# Patient Record
Sex: Female | Born: 1995 | Race: Black or African American | Hispanic: No | Marital: Single | Smoking: Never smoker
Health system: Southern US, Community
[De-identification: ages and names within clinical notes are randomized; demographics above are authoritative.]

## PROBLEM LIST (undated history)

## (undated) ENCOUNTER — Inpatient Hospital Stay (HOSPITAL_COMMUNITY): Payer: Self-pay

## (undated) DIAGNOSIS — N920 Excessive and frequent menstruation with regular cycle: Secondary | ICD-10-CM

## (undated) DIAGNOSIS — R011 Cardiac murmur, unspecified: Secondary | ICD-10-CM

## (undated) DIAGNOSIS — A599 Trichomoniasis, unspecified: Secondary | ICD-10-CM

## (undated) DIAGNOSIS — D649 Anemia, unspecified: Secondary | ICD-10-CM

## (undated) HISTORY — DX: Cardiac murmur, unspecified: R01.1

## (undated) HISTORY — PX: LIPOSUCTION: SHX10

## (undated) HISTORY — PX: WISDOM TOOTH EXTRACTION: SHX21

## (undated) HISTORY — DX: Excessive and frequent menstruation with regular cycle: N92.0

---
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
## 2013-10-28 ENCOUNTER — Emergency Department (HOSPITAL_COMMUNITY)
Admission: EM | Admit: 2013-10-28 | Discharge: 2013-10-28 | Disposition: A | Discharge status: Home / Self Care | Payer: Self-pay | Source: Home / Self Care

## 2013-10-28 NOTE — ED Notes (Signed)
NO ANSWER IN LOBBY X 3

## 2013-10-28 NOTE — ED Notes (Signed)
No answer in waiting room 

## 2014-02-21 ENCOUNTER — Other Ambulatory Visit (HOSPITAL_COMMUNITY): Payer: Self-pay | Admitting: Family Medicine

## 2014-02-21 ENCOUNTER — Ambulatory Visit (HOSPITAL_COMMUNITY): Payer: BLUE CROSS/BLUE SHIELD | Attending: Family Medicine | Admitting: Radiology

## 2014-02-21 DIAGNOSIS — R011 Cardiac murmur, unspecified: Secondary | ICD-10-CM

## 2014-02-21 NOTE — Progress Notes (Signed)
Echocardiogram performed.  

## 2014-02-26 ENCOUNTER — Other Ambulatory Visit (HOSPITAL_COMMUNITY): Payer: Self-pay

## 2014-06-19 ENCOUNTER — Emergency Department (HOSPITAL_COMMUNITY)
Admission: EM | Admit: 2014-06-19 | Discharge: 2014-06-19 | Discharge status: Absent without leave | Payer: Self-pay | Source: Home / Self Care

## 2015-03-01 ENCOUNTER — Inpatient Hospital Stay (HOSPITAL_COMMUNITY): Payer: Medicaid Other

## 2015-03-01 ENCOUNTER — Inpatient Hospital Stay (HOSPITAL_COMMUNITY)
Admission: AD | Admit: 2015-03-01 | Discharge: 2015-03-01 | Disposition: A | Discharge status: Home / Self Care | Payer: Medicaid Other | Source: Ambulatory Visit | Attending: Obstetrics and Gynecology | Admitting: Obstetrics and Gynecology

## 2015-03-01 ENCOUNTER — Encounter (HOSPITAL_COMMUNITY): Payer: Self-pay

## 2015-03-01 DIAGNOSIS — R011 Cardiac murmur, unspecified: Secondary | ICD-10-CM | POA: Diagnosis not present

## 2015-03-01 DIAGNOSIS — Z3A08 8 weeks gestation of pregnancy: Secondary | ICD-10-CM | POA: Diagnosis not present

## 2015-03-01 DIAGNOSIS — O021 Missed abortion: Secondary | ICD-10-CM | POA: Insufficient documentation

## 2015-03-01 HISTORY — DX: Cardiac murmur, unspecified: R01.1

## 2015-03-01 LAB — URINALYSIS, ROUTINE W REFLEX MICROSCOPIC
Bilirubin Urine: NEGATIVE
Glucose, UA: NEGATIVE mg/dL
Hgb urine dipstick: NEGATIVE
Ketones, ur: 15 mg/dL — AB
LEUKOCYTES UA: NEGATIVE
NITRITE: NEGATIVE
PH: 5.5 (ref 5.0–8.0)
PROTEIN: NEGATIVE mg/dL
Specific Gravity, Urine: 1.025 (ref 1.005–1.030)

## 2015-03-01 LAB — HCG, QUANTITATIVE, PREGNANCY: HCG, BETA CHAIN, QUANT, S: 49517 m[IU]/mL — AB (ref <5)

## 2015-03-01 LAB — POCT PREGNANCY, URINE: Preg Test, Ur: POSITIVE — AB

## 2015-03-01 NOTE — MAU Note (Signed)
Had a u/s at the [facility] center today and they were not able to find a heartbeat.  Having low abd pain on both sides once last week and again today.  No bleeding.

## 2015-03-01 NOTE — Discharge Instructions (Signed)
Miscarriage °A miscarriage is the loss of an unborn baby (fetus) before the 20th week of pregnancy. The cause is often unknown.  °HOME CARE °· You may need to stay in bed (bed rest), or you may be able to do light activity. Go about activity as told by your doctor. °· Have help at home. °· Write down how many pads you use each day. Write down how soaked they are. °· Do not use tampons. Do not wash out your vagina (douche) or have sex (intercourse) until your doctor approves. °· Only take medicine as told by your doctor. °· Do not take aspirin. °· Keep all doctor visits as told. °· If you or your partner have problems with grieving, talk to your doctor. You can also try counseling. Give yourself time to grieve before trying to get pregnant again. °GET HELP RIGHT AWAY IF: °· You have bad cramps or pain in your back or belly (abdomen). °· You have a fever. °· You pass large clumps of blood (clots) from your vagina that are walnut-sized or larger. Save the clumps for your doctor to see. °· You pass large amounts of tissue from your vagina. Save the tissue for your doctor to see. °· You have more bleeding. °· You have thick, bad-smelling fluid (discharge) coming from the vagina. °· You get lightheaded, weak, or you pass out (faint). °· You have chills. °MAKE SURE YOU: °· Understand these instructions. °· Will watch your condition. °· Will get help right away if you are not doing well or get worse. °  °This information is not intended to replace advice given to you by your health care provider. Make sure you discuss any questions you have with your health care provider. °  °Document Released: 03/15/2011 Document Reviewed: 03/15/2011 °Elsevier Interactive Patient Education ©2016 Elsevier Inc. ° °

## 2015-03-01 NOTE — H&P (Signed)
Courtney Mitchell is a 20 y.o. female, G3P0 at 10.4 weeks (as dated by 7.3 wk sono) who presents from the [facility] after a routine scan. Per pt report, fetal heartbeat was absent and states was instructed to f/u with her OB provider. She presented to MAU for confirmation of loss. Denies VB. Reports cramping 5/10.  Doptones absent. U/S dx today of 8.0 wk IUFD. QHCG [id number].  Pt elects D&E as soon as possible; was not interested in other options, i.e. Cytotec and Expectant management. She also desires recurrent miscarriage work-up.  I consulted w/ Dr. Dion Body and she scheduled pt for a D&E on Monday, 03/03/15 at 1:30 PM with Dr. Estanislado Pandy. Pt aware that the date and time of her surgery may change and she will be notified if that happens.   Patient Active Problem List   Diagnosis Date Noted  . Missed abortion 03/01/2015  . Heart murmur 03/01/2015  . History of 1 spontaneous abortion 03/01/2015  . 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 Menorrhagia 08/03/2011    OB History    Gravida Para Term Preterm AB TAB SAB Ectopic Multiple Living      SAB per pt report in Nov 2016 - passed tissue in toilet at home TAB per [facility] records (Mar 2016)  Past Medical History  Diagnosis Date  . Anemia    Past Surgical History  Procedure Laterality Date  . Wisdom tooth extraction     Family History: family history includes Asthma in her father; Cancer in her other; Hypertension in her father.Congestive heart failure in her MGF. Social History:  reports that she has never smoked. She does not have any smokeless tobacco history on file. She reports that she does not drink alcohol or use illicit drugs. 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  ROS: Cramping, no bleeding  No Known Allergies     Blood pressure 129/69, pulse 79,  temperature 98.4 F (36.9 C), temperature source Oral, resp. rate 16, SpO2 98 %.  Chest clear Heart RRR - murmur not appreciated Abd gravid, NT Pelvic: Deferred Ext: WNL  Prenatal labs: ABO, Rh: O positive (02/14/15) Antibody: Neg (02/14/15)  Rubella: Immune (02/14/15) RPR: NR (02/14/15) HBsAg: Neg (02/14/15) HIV: Neg (02/14/15) Sickle cell/Hgb electrophoresis: Normal study (02/14/15) Chlamydia: Neg (02/14/15) Gonorrhea: Neg (02/14/15) Other: Urine culture neg (02/14/15)  Hgb 12.5 at NOB (02/14/15)  No results found for this or any previous visit (from the past 24 hour(s)). CLINICAL DATA: Patient referred from outside facility for no fetal heart tones.  EXAM: OBSTETRIC <14 WK [place] AND TRANSVAGINAL OB US  TECHNIQUE: Both transabdominal and transvaginal ultrasound examinations were performed for complete evaluation of the gestation as well as the maternal uterus, adnexal regions, and pelvic cul-de-sac. Transvaginal technique was performed to assess early pregnancy.  COMPARISON: None.  FINDINGS: Intrauterine gestational sac: Visualized/normal in shape.  Yolk sac: Present  Embryo: Present  Cardiac Activity: Not present  Heart Rate: 0 bpm  CRL: 15.7 mm 8 w 0 d  Subchorionic hemorrhage: No subchorionic hemorrhage.  Maternal uterus/adnexae: Normal right and left ovaries. No pelvic free fluid.  IMPRESSION: Crown-rump length of 15.7 mm without fetal heartbeat. Findings meet definitive criteria for failed pregnancy. This follows SRU consensus guidelines: Diagnostic Criteria for Nonviable Pregnancy Early in the First Trimester. Macy Mis J Med [id number].  Electronically Signed  By: Annia Belt M.D.  On: 03/01/2015 21:45  Assessment: Missed AB at 8.0 weeks size of fetus O+ type Desires definitive treatment w/ D&E  Plan: Admit to [facility]. Pt to arrive at noon; NPO 8 hrs prior to procedure Routine pre-op orders along w/ recurrent miscarriage  work-up R&B of D&E reviewed w/ pt and FOB. They understand the risks are, but not limited to bleeding, infection, perforation of the uterus and Ashermans syndrome which can lead to infertility Support to couple for loss   Robyne Askew, MS 03/02/2015, 9:32 PM

## 2015-03-03 ENCOUNTER — Encounter (HOSPITAL_COMMUNITY)
Admission: RE | Disposition: A | Discharge status: Home / Self Care | Payer: Self-pay | Source: Ambulatory Visit | Attending: Obstetrics and Gynecology

## 2015-03-03 ENCOUNTER — Encounter (HOSPITAL_COMMUNITY): Payer: Self-pay

## 2015-03-03 ENCOUNTER — Ambulatory Visit (HOSPITAL_COMMUNITY): Payer: Medicaid Other | Admitting: Anesthesiology

## 2015-03-03 ENCOUNTER — Ambulatory Visit (HOSPITAL_COMMUNITY)
Admission: RE | Admit: 2015-03-03 | Discharge: 2015-03-03 | Disposition: A | Discharge status: Home / Self Care | Payer: Medicaid Other | Source: Ambulatory Visit | Attending: Obstetrics and Gynecology | Admitting: Obstetrics and Gynecology

## 2015-03-03 ENCOUNTER — Ambulatory Visit (HOSPITAL_COMMUNITY): Payer: Medicaid Other

## 2015-03-03 DIAGNOSIS — O039 Complete or unspecified spontaneous abortion without complication: Secondary | ICD-10-CM

## 2015-03-03 DIAGNOSIS — Z3A08 8 weeks gestation of pregnancy: Secondary | ICD-10-CM | POA: Diagnosis not present

## 2015-03-03 DIAGNOSIS — O021 Missed abortion: Secondary | ICD-10-CM | POA: Insufficient documentation

## 2015-03-03 HISTORY — PX: DILATION AND EVACUATION: SHX1459

## 2015-03-03 SURGERY — DILATION AND EVACUATION, UTERUS
Anesthesia: Monitor Anesthesia Care | Site: Vagina

## 2015-03-03 MED ORDER — DEXAMETHASONE SODIUM PHOSPHATE 10 MG/ML IJ SOLN
INTRAMUSCULAR | Status: DC | PRN
  Administered 2015-03-03: 4 mg via INTRAVENOUS

## 2015-03-03 MED ORDER — LIDOCAINE HCL (CARDIAC) 20 MG/ML IV SOLN
INTRAVENOUS | Status: DC | PRN
  Administered 2015-03-03: 60 mg via INTRAVENOUS

## 2015-03-03 MED ORDER — HYDROCODONE-ACETAMINOPHEN 5-325 MG PO TABS
ORAL_TABLET | ORAL | Status: AC
Start: 2015-03-03 — End: 2015-03-03
  Administered 2015-03-03: 1 via ORAL
  Filled 2015-03-03: qty 1

## 2015-03-03 MED ORDER — FENTANYL CITRATE (PF) 100 MCG/2ML IJ SOLN
INTRAMUSCULAR | Status: AC
  Filled 2015-03-03: qty 2

## 2015-03-03 MED ORDER — HYDROCODONE-ACETAMINOPHEN 5-325 MG PO TABS
1.0000 | ORAL_TABLET | Freq: Once | ORAL | Status: AC
  Administered 2015-03-03: 1 via ORAL

## 2015-03-03 MED ORDER — FENTANYL CITRATE (PF) 100 MCG/2ML IJ SOLN
25.0000 ug | INTRAMUSCULAR | Status: DC | PRN
  Administered 2015-03-03 (×2): 25 ug via INTRAVENOUS
  Administered 2015-03-03 (×2): 50 ug via INTRAVENOUS

## 2015-03-03 MED ORDER — PROPOFOL 10 MG/ML IV BOLUS
INTRAVENOUS | Status: AC
  Filled 2015-03-03: qty 20

## 2015-03-03 MED ORDER — SCOPOLAMINE 1 MG/3DAYS TD PT72
MEDICATED_PATCH | TRANSDERMAL | Status: AC
  Administered 2015-03-03: 1.5 mg via TRANSDERMAL
  Filled 2015-03-03: qty 1

## 2015-03-03 MED ORDER — KETOROLAC TROMETHAMINE 30 MG/ML IJ SOLN
INTRAMUSCULAR | Status: DC | PRN
  Administered 2015-03-03: 30 mg via INTRAVENOUS

## 2015-03-03 MED ORDER — PROMETHAZINE HCL 25 MG/ML IJ SOLN: 6.2500 mg | INTRAMUSCULAR | Status: DC | PRN

## 2015-03-03 MED ORDER — LIDOCAINE HCL (CARDIAC) 20 MG/ML IV SOLN
INTRAVENOUS | Status: AC
  Filled 2015-03-03: qty 5

## 2015-03-03 MED ORDER — ONDANSETRON HCL 4 MG/2ML IJ SOLN
INTRAMUSCULAR | Status: AC
  Filled 2015-03-03: qty 2

## 2015-03-03 MED ORDER — CHLOROPROCAINE HCL 1 % IJ SOLN
INTRAMUSCULAR | Status: DC | PRN
  Administered 2015-03-03: 20 mL

## 2015-03-03 MED ORDER — FENTANYL CITRATE (PF) 100 MCG/2ML IJ SOLN
INTRAMUSCULAR | Status: DC | PRN
  Administered 2015-03-03 (×2): 100 ug via INTRAVENOUS

## 2015-03-03 MED ORDER — IBUPROFEN 600 MG PO TABS: 600.0000 mg | ORAL_TABLET | Freq: Four times a day (QID) | ORAL | Status: DC | PRN

## 2015-03-03 MED ORDER — DEXAMETHASONE SODIUM PHOSPHATE 4 MG/ML IJ SOLN
INTRAMUSCULAR | Status: AC
  Filled 2015-03-03: qty 1

## 2015-03-03 MED ORDER — FENTANYL CITRATE (PF) 100 MCG/2ML IJ SOLN
INTRAMUSCULAR | Status: AC
  Administered 2015-03-03: 25 ug via INTRAVENOUS
  Filled 2015-03-03: qty 2

## 2015-03-03 MED ORDER — MIDAZOLAM HCL 2 MG/2ML IJ SOLN
INTRAMUSCULAR | Status: AC
  Filled 2015-03-03: qty 2

## 2015-03-03 MED ORDER — PROPOFOL 500 MG/50ML IV EMUL
INTRAVENOUS | Status: DC | PRN
  Administered 2015-03-03: 50 mg via INTRAVENOUS
  Administered 2015-03-03: 20 mg via INTRAVENOUS
  Administered 2015-03-03: 30 mg via INTRAVENOUS
  Administered 2015-03-03: 50 mg via INTRAVENOUS
  Administered 2015-03-03 (×2): 20 mg via INTRAVENOUS
  Administered 2015-03-03: 30 mg via INTRAVENOUS
  Administered 2015-03-03: 20 mg via INTRAVENOUS

## 2015-03-03 MED ORDER — LACTATED RINGERS IV SOLN
INTRAVENOUS | Status: DC
  Administered 2015-03-03 (×2): via INTRAVENOUS

## 2015-03-03 MED ORDER — MIDAZOLAM HCL 2 MG/2ML IJ SOLN
INTRAMUSCULAR | Status: DC | PRN
  Administered 2015-03-03: 2 mg via INTRAVENOUS

## 2015-03-03 MED ORDER — SCOPOLAMINE 1 MG/3DAYS TD PT72
1.0000 | MEDICATED_PATCH | Freq: Once | TRANSDERMAL | Status: DC
  Administered 2015-03-03: 1.5 mg via TRANSDERMAL

## 2015-03-03 MED ORDER — LACTATED RINGERS IV SOLN
INTRAVENOUS | Status: DC
  Administered 2015-03-03: 125 mL/h via INTRAVENOUS

## 2015-03-03 MED ORDER — KETOROLAC TROMETHAMINE 30 MG/ML IJ SOLN
INTRAMUSCULAR | Status: AC
  Filled 2015-03-03: qty 1

## 2015-03-03 MED ORDER — ONDANSETRON HCL 4 MG/2ML IJ SOLN
INTRAMUSCULAR | Status: DC | PRN
  Administered 2015-03-03: 4 mg via INTRAVENOUS

## 2015-03-03 MED ORDER — CHLOROPROCAINE HCL 1 % IJ SOLN
INTRAMUSCULAR | Status: AC
  Filled 2015-03-03: qty 30

## 2015-03-03 SURGICAL SUPPLY — 17 items
CATH ROBINSON RED A/P 16FR (CATHETERS) ×3 IMPLANT
CLOTH BEACON ORANGE TIMEOUT ST (SAFETY) ×3 IMPLANT
DECANTER SPIKE VIAL GLASS SM (MISCELLANEOUS) ×3 IMPLANT
GLOVE BIOGEL PI IND STRL 7.0 (GLOVE) ×2 IMPLANT
GLOVE BIOGEL PI INDICATOR 7.0 (GLOVE) ×4
GOWN STRL REUS W/TWL LRG LVL3 (GOWN DISPOSABLE) ×6 IMPLANT
KIT BERKELEY 1ST TRIMESTER 3/8 (MISCELLANEOUS) ×3 IMPLANT
NS IRRIG 1000ML POUR BTL (IV SOLUTION) ×3 IMPLANT
PACK VAGINAL MINOR WOMEN LF (CUSTOM PROCEDURE TRAY) ×3 IMPLANT
PAD OB MATERNITY 4.3X12.25 (PERSONAL CARE ITEMS) ×3 IMPLANT
PAD PREP 24X48 CUFFED NSTRL (MISCELLANEOUS) ×3 IMPLANT
SET BERKELEY SUCTION TUBING (SUCTIONS) ×3 IMPLANT
TOWEL OR 17X24 6PK STRL BLUE (TOWEL DISPOSABLE) ×3 IMPLANT
VACURETTE 10 RIGID CVD (CANNULA) IMPLANT
VACURETTE 7MM CVD STRL WRAP (CANNULA) IMPLANT
VACURETTE 8 RIGID CVD (CANNULA) IMPLANT
VACURETTE 9 RIGID CVD (CANNULA) ×3 IMPLANT

## 2015-03-03 NOTE — Op Note (Signed)
Preoperative diagnosis: Missed AB  Postoperative diagnosis: Same  Anesthesia: IV sedation and paracervical block  Anesthesiologist: Dr. Desmond Lope  Procedure: Dilatation and evacuation  Surgeon: Dr. Dois Davenport Takeysha Bonk  Estimated blood loss: Minimal  Procedure:  After being informed of the planned procedure with possible complications including bleeding, infection and injury to uterus, informed consent is obtained and patient is taken to or #4. She is placed in the lithotomy position and given IV sedation without complication. She is then prepped and draped in a sterile  fashion and her bladder is emptied with an in and out red rubber catheter.  Pelvic exam reveals a anteverted  uterus compatible with [redacted] weeks gestation. Both adnexa are felt and normal.  A weighted speculum is inserted in the vagina and the anterior lip of the cervix was grasped with a tenaculum forcep. We proceed with a paracervical block using 20 cc of Nesacaine 1%. The uterus was then sounded at  9 cm. The cervix was easily dilated using Hegar dilator until  #31. Using a #9 curved cannula, we proceed with evacuation of products of conception without difficulty. We then proceed with sharp curettage of the uterine cavity to confirm complete evacuation.  Instruments are then removed. Instrument and sponge count is complete x2.   Estimated blood loss is minimal.  The procedure is very well tolerated by the patient is taken to recovery room in a well and stable condition.  Specimen: Products of conception sent to pathology and for chromosome testing per patient request

## 2015-03-03 NOTE — H&P (View-Only) (Signed)
Courtney Mitchell is a 20 y.o. female, G3P0 at 10.4 weeks (as dated by 7.3 wk sono) who presents from the [facility] after a routine scan. Per pt report, fetal heartbeat was absent and states was instructed to f/u with her OB provider. She presented to MAU for confirmation of loss. Denies VB. Reports cramping 5/10.  Doptones absent. U/S dx today of 8.0 wk IUFD. QHCG 49517.  Pt elects D&E as soon as possible; was not interested in other options, i.e. Cytotec and Expectant management. She also desires recurrent miscarriage work-up.  I consulted w/ Dr. Varnado and she scheduled pt for a D&E on Monday, 03/03/15 at 1:30 PM with Dr. Rivard. Pt aware that the date and time of her surgery may change and she will be notified if that happens.   Patient Active Problem List   Diagnosis Date Noted  . Missed abortion 03/01/2015  . Heart murmur 03/01/2015  . History of 1 spontaneous abortion 03/01/2015  . 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 Menorrhagia 08/03/2011    OB History    Gravida Para Term Preterm AB TAB SAB Ectopic Multiple Living   1 0 0 0 0 0 0 0 0 0    SAB per pt report in Nov 2016 - passed tissue in toilet at home TAB per [facility] records (Mar 2016)  Past Medical History  Diagnosis Date  . Anemia    Past Surgical History  Procedure Laterality Date  . Wisdom tooth extraction     Family History: family history includes Asthma in her father; Cancer in her other; Hypertension in her father.Congestive heart failure in her MGF. Social History:  reports that she has never smoked. She does not have any smokeless tobacco history on file. She reports that she does not drink alcohol or use illicit drugs. 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  ROS: Cramping, no bleeding  No Known Allergies     Blood pressure 129/69, pulse 79,  temperature 98.4 F (36.9 C), temperature source Oral, resp. rate 16, SpO2 98 %.  Chest clear Heart RRR - murmur not appreciated Abd gravid, NT Pelvic: Deferred Ext: WNL  Prenatal labs: ABO, Rh: O positive (02/14/15) Antibody: Neg (02/14/15)  Rubella: Immune (02/14/15) RPR: NR (02/14/15) HBsAg: Neg (02/14/15) HIV: Neg (02/14/15) Sickle cell/Hgb electrophoresis: Normal study (02/14/15) Chlamydia: Neg (02/14/15) Gonorrhea: Neg (02/14/15) Other: Urine culture neg (02/14/15)  Hgb 12.5 at NOB (02/14/15)  No results found for this or any previous visit (from the past 24 hour(s)). CLINICAL DATA: Patient referred from outside facility for no fetal heart tones.  EXAM: OBSTETRIC <14 WK US AND TRANSVAGINAL OB US  TECHNIQUE: Both transabdominal and transvaginal ultrasound examinations were performed for complete evaluation of the gestation as well as the maternal uterus, adnexal regions, and pelvic cul-de-sac. Transvaginal technique was performed to assess early pregnancy.  COMPARISON: None.  FINDINGS: Intrauterine gestational sac: Visualized/normal in shape.  Yolk sac: Present  Embryo: Present  Cardiac Activity: Not present  Heart Rate: 0 bpm  CRL: 15.7 mm 8 w 0 d  Subchorionic hemorrhage: No subchorionic hemorrhage.  Maternal uterus/adnexae: Normal right and left ovaries. No pelvic free fluid.  IMPRESSION: Crown-rump length of 15.7 mm without fetal heartbeat. Findings meet definitive criteria for failed pregnancy. This follows SRU consensus guidelines: Diagnostic Criteria for Nonviable Pregnancy Early in the First Trimester. N Engl J Med 2013;369:1443-51.     Electronically Signed  By: Annia Belt M.D.  On: 03/01/2015 21:45  Assessment: Missed AB at 8.0 weeks size of fetus O+ type Desires definitive treatment w/ D&E  Plan: Admit to [facility]. Pt to arrive at noon; NPO 8 hrs prior to procedure Routine pre-op orders along w/ recurrent miscarriage  work-up R&B of D&E reviewed w/ pt and FOB. They understand the risks are, but not limited to bleeding, infection, perforation of the uterus and Ashermans syndrome which can lead to infertility Support to couple for loss   Robyne Askew, MS 03/02/2015, 9:32 PM

## 2015-03-03 NOTE — Discharge Instructions (Signed)
POST-OPERATIVE INSTRUCTIONS TO PATIENT  Call [facility]  [phone])  for excessive pain, bleeding or temperature greater than or equal to 100.4 degrees (orally).    No driving for 1 day No sexual activity for 1 week No tampon for 1 week  Pain management:  Use Ibuprofen 600 mg every 6 hours for 5 days and then as needed. Use your pain medication as needed to maintain a pain level at or below 3/10 Use Colace 1-2 capsules per day as long as you are using pain  medication to avoid constipation.       Diet: normal  Bathing: may shower day after surgery    Return to Dr. Estanislado Pandy on 03/31/15 at 2:00 pm      [facility] A MD 03/03/2015 3:48 PM

## 2015-03-03 NOTE — Transfer of Care (Signed)
Immediate Anesthesia Transfer of Care Note  Patient: Courtney Mitchell  Procedure(s) Performed: Procedure(s): DILATATION AND EVACUATION (N/A)  Patient Location: PACU  Anesthesia Type:MAC  Level of Consciousness: awake, alert  and oriented  Airway & Oxygen Therapy: Patient Spontanous Breathing  Post-op Assessment: Report given to RN and Post -op Vital signs reviewed and stable  Post vital signs: Reviewed and stable  Last Vitals:  Filed Vitals:   03/03/15 1304  BP: 117/77  Pulse: 95  Temp: 36.8 C  Resp: 6    Complications: No apparent anesthesia complications

## 2015-03-03 NOTE — Anesthesia Preprocedure Evaluation (Addendum)
Anesthesia Evaluation  Patient identified by MRN, date of birth, ID band Patient awake    Reviewed: Allergy & Precautions, NPO status , Patient's Chart, lab work & pertinent test results  History of Anesthesia Complications Negative for: history of anesthetic complications  Airway Mallampati: I  TM Distance: >3 FB Neck ROM: Full    Dental  (+) Teeth Intact, Dental Advisory Given   Pulmonary neg pulmonary ROS,    Pulmonary exam normal breath sounds clear to auscultation       Cardiovascular Exercise Tolerance: Good + Valvular Problems/Murmurs (heart murmur--benign per patient) MR  Rhythm:Regular Rate:Normal + Systolic murmurs Echo 2/16: Study Conclusions  - Left ventricle: The cavity size was normal. Wall thickness wasnormal. Systolic function was normal. The estimated ejectionfraction was in the range of 55% to 60%. Wall motion was normal;there were no regional wall motion abnormalities. Leftventricular diastolic function parameters were normal. - Pericardium, extracardiac: A trivial pericardial effusion wasidentified.  Impressions:  - Normal LV function; trace MR and TR.   Neuro/Psych negative neurological ROS  negative psych ROS   GI/Hepatic negative GI ROS, Neg liver ROS,   Endo/Other  negative endocrine ROS  Renal/GU negative Renal ROS     Musculoskeletal negative musculoskeletal ROS (+)   Abdominal   Peds  Hematology  (+) Blood dyscrasia, anemia ,   Anesthesia Other Findings Day of surgery medications reviewed with the patient.  Reproductive/Obstetrics 20 y.o. female, G3P0 at 10.6 weeks with missed abortion                           Anesthesia Physical Anesthesia Plan  ASA: II  Anesthesia Plan: MAC   Post-op Pain Management:    Induction: Intravenous  Airway Management Planned: Nasal Cannula  Additional Equipment:   Intra-op Plan:   Post-operative Plan:    Informed Consent: I have reviewed the patients History and Physical, chart, labs and discussed the procedure including the risks, benefits and alternatives for the proposed anesthesia with the patient or authorized representative who has indicated his/her understanding and acceptance.   Dental advisory given  Plan Discussed with: CRNA and Anesthesiologist  Anesthesia Plan Comments: (Discussed risks/benefits/alternatives to MAC sedation including need for ventilatory support, hypotension, need for conversion to general anesthesia.  All patient questions answered.  Patient wished to proceed.  Ate full breakfast at 0800. Will wait 8 hours prior to proceeding.)       Anesthesia Quick Evaluation

## 2015-03-03 NOTE — Interval H&P Note (Signed)
History and Physical Interval Note:  03/03/2015 3:47 PM  Courtney Mitchell  has presented today for surgery, with the diagnosis of retained products of conception  The various methods of treatment have been discussed with the patient and family. After consideration of risks, benefits and other options for treatment, the patient has consented to  Procedure(s): DILATATION AND EVACUATION (N/A) as a surgical intervention .  The patient's history has been reviewed, patient examined, no change in status, stable for surgery.  I have reviewed the patient's chart and labs.  Questions were answered to the patient's satisfaction.     Miia Blanks A

## 2015-03-04 ENCOUNTER — Encounter (HOSPITAL_COMMUNITY): Payer: Self-pay | Admitting: Obstetrics and Gynecology

## 2015-03-05 NOTE — Anesthesia Postprocedure Evaluation (Signed)
Anesthesia Post Note  Patient: Courtney Mitchell  Procedure(s) Performed: Procedure(s) (LRB): DILATATION AND EVACUATION (N/A)  Patient location during evaluation: PACU Anesthesia Type: MAC Level of consciousness: awake and alert Pain management: pain level controlled Vital Signs Assessment: post-procedure vital signs reviewed and stable Respiratory status: spontaneous breathing, nonlabored ventilation, respiratory function stable and patient connected to nasal cannula oxygen Cardiovascular status: blood pressure returned to baseline and stable Postop Assessment: no signs of nausea or vomiting Anesthetic complications: no    Last Vitals:  Filed Vitals:   03/03/15 1800 03/03/15 1840  BP: 126/77 135/72  Pulse: 97 84  Temp: 36.8 C 37 C  Resp: 15 16    Last Pain:  Filed Vitals:   03/03/15 1910  PainSc: 3                  Cecile Hearing

## 2015-03-26 LAB — CHROMOSOME STD, POC(TISSUE)-NCBH

## 2015-04-12 ENCOUNTER — Inpatient Hospital Stay (HOSPITAL_COMMUNITY): Payer: Medicaid Other | Admitting: Anesthesiology

## 2015-04-12 ENCOUNTER — Encounter (HOSPITAL_COMMUNITY): Payer: Self-pay

## 2015-04-12 ENCOUNTER — Encounter (HOSPITAL_COMMUNITY)
Admission: AD | Disposition: A | Discharge status: Home / Self Care | Payer: Self-pay | Source: Ambulatory Visit | Attending: Obstetrics and Gynecology

## 2015-04-12 ENCOUNTER — Inpatient Hospital Stay (HOSPITAL_COMMUNITY): Payer: Medicaid Other

## 2015-04-12 ENCOUNTER — Ambulatory Visit (HOSPITAL_COMMUNITY)
Admission: AD | Admit: 2015-04-12 | Discharge: 2015-04-12 | Disposition: A | Discharge status: Home / Self Care | Payer: Medicaid Other | Source: Ambulatory Visit | Attending: Obstetrics and Gynecology | Admitting: Obstetrics and Gynecology

## 2015-04-12 DIAGNOSIS — N939 Abnormal uterine and vaginal bleeding, unspecified: Secondary | ICD-10-CM

## 2015-04-12 DIAGNOSIS — R938 Abnormal findings on diagnostic imaging of other specified body structures: Secondary | ICD-10-CM | POA: Insufficient documentation

## 2015-04-12 DIAGNOSIS — O031 Delayed or excessive hemorrhage following incomplete spontaneous abortion: Secondary | ICD-10-CM | POA: Diagnosis not present

## 2015-04-12 DIAGNOSIS — Z79899 Other long term (current) drug therapy: Secondary | ICD-10-CM | POA: Diagnosis not present

## 2015-04-12 HISTORY — PX: DILATION AND EVACUATION: SHX1459

## 2015-04-12 LAB — CBC
HCT: 33.9 % — ABNORMAL LOW (ref 36.0–46.0)
HEMATOCRIT: 29.1 % — AB (ref 36.0–46.0)
HEMOGLOBIN: 11.4 g/dL — AB (ref 12.0–15.0)
HEMOGLOBIN: 9.7 g/dL — AB (ref 12.0–15.0)
MCH: 28.9 pg (ref 26.0–34.0)
MCH: 28.8 pg (ref 26.0–34.0)
MCHC: 33.6 g/dL (ref 30.0–36.0)
MCHC: 33.3 g/dL (ref 30.0–36.0)
MCV: 85.8 fL (ref 78.0–100.0)
MCV: 86.4 fL (ref 78.0–100.0)
PLATELETS: 306 10*3/uL (ref 150–400)
PLATELETS: 246 10*3/uL (ref 150–400)
RBC: 3.95 MIL/uL (ref 3.87–5.11)
RBC: 3.37 MIL/uL — AB (ref 3.87–5.11)
RDW: 13.5 % (ref 11.5–15.5)
RDW: 13.6 % (ref 11.5–15.5)
WBC: 7.3 10*3/uL (ref 4.0–10.5)
WBC: 10.7 10*3/uL — ABNORMAL HIGH (ref 4.0–10.5)

## 2015-04-12 LAB — POCT PREGNANCY, URINE: PREG TEST UR: NEGATIVE

## 2015-04-12 LAB — PREPARE RBC (CROSSMATCH)

## 2015-04-12 SURGERY — DILATION AND EVACUATION, UTERUS
Anesthesia: Monitor Anesthesia Care | Site: Vagina

## 2015-04-12 MED ORDER — FENTANYL CITRATE (PF) 100 MCG/2ML IJ SOLN
INTRAMUSCULAR | Status: AC
  Filled 2015-04-12: qty 2

## 2015-04-12 MED ORDER — PROPOFOL 500 MG/50ML IV EMUL
INTRAVENOUS | Status: DC | PRN
  Administered 2015-04-12: 200 ug/kg/min via INTRAVENOUS

## 2015-04-12 MED ORDER — KETOROLAC TROMETHAMINE 30 MG/ML IJ SOLN
INTRAMUSCULAR | Status: DC | PRN
  Administered 2015-04-12: 30 mg via INTRAVENOUS

## 2015-04-12 MED ORDER — ONDANSETRON HCL 4 MG/2ML IJ SOLN
INTRAMUSCULAR | Status: DC | PRN
  Administered 2015-04-12: 4 mg via INTRAVENOUS

## 2015-04-12 MED ORDER — FENTANYL CITRATE (PF) 100 MCG/2ML IJ SOLN
25.0000 ug | INTRAMUSCULAR | Status: DC | PRN
  Administered 2015-04-12 (×4): 50 ug via INTRAVENOUS

## 2015-04-12 MED ORDER — PROMETHAZINE HCL 25 MG RE SUPP
25.0000 mg | Freq: Once | RECTAL | Status: AC
  Administered 2015-04-12: 25 mg via RECTAL

## 2015-04-12 MED ORDER — FAMOTIDINE IN NACL 20-0.9 MG/50ML-% IV SOLN
20.0000 mg | Freq: Once | INTRAVENOUS | Status: AC
  Administered 2015-04-12: 20 mg via INTRAVENOUS

## 2015-04-12 MED ORDER — HYDROCODONE-ACETAMINOPHEN 7.5-325 MG PO TABS: 1.0000 | ORAL_TABLET | Freq: Once | ORAL | Status: DC | PRN

## 2015-04-12 MED ORDER — FAMOTIDINE IN NACL 20-0.9 MG/50ML-% IV SOLN
INTRAVENOUS | Status: AC
  Administered 2015-04-12: 20 mg via INTRAVENOUS
  Filled 2015-04-12: qty 50

## 2015-04-12 MED ORDER — MIDAZOLAM HCL 2 MG/2ML IJ SOLN
INTRAMUSCULAR | Status: AC
  Filled 2015-04-12: qty 2

## 2015-04-12 MED ORDER — KETOROLAC TROMETHAMINE 30 MG/ML IJ SOLN: 30.0000 mg | Freq: Once | INTRAMUSCULAR | Status: DC

## 2015-04-12 MED ORDER — OXYCODONE-ACETAMINOPHEN 5-325 MG PO TABS
1.0000 | ORAL_TABLET | Freq: Once | ORAL | Status: AC
Start: 2015-04-12 — End: 2015-04-12
  Administered 2015-04-12: 1 via ORAL
  Filled 2015-04-12: qty 1

## 2015-04-12 MED ORDER — CEFAZOLIN SODIUM-DEXTROSE 2-3 GM-% IV SOLR
INTRAVENOUS | Status: DC | PRN
  Administered 2015-04-12: 2 g via INTRAVENOUS

## 2015-04-12 MED ORDER — CITRIC ACID-SODIUM CITRATE 334-500 MG/5ML PO SOLN
ORAL | Status: AC
  Administered 2015-04-12: 30 mL via ORAL
  Filled 2015-04-12: qty 15

## 2015-04-12 MED ORDER — SODIUM CHLORIDE 0.9 % IV SOLN
INTRAVENOUS | Status: DC | PRN
  Administered 2015-04-12: 20:00:00 via INTRAVENOUS

## 2015-04-12 MED ORDER — PROPOFOL 500 MG/50ML IV EMUL
INTRAVENOUS | Status: AC
  Filled 2015-04-12: qty 50

## 2015-04-12 MED ORDER — CITRIC ACID-SODIUM CITRATE 334-500 MG/5ML PO SOLN
30.0000 mL | Freq: Once | ORAL | Status: AC
  Administered 2015-04-12: 30 mL via ORAL

## 2015-04-12 MED ORDER — FERROUS SULFATE 325 (65 FE) MG PO TABS: 325.0000 mg | ORAL_TABLET | Freq: Every day | ORAL | Status: DC

## 2015-04-12 MED ORDER — LIDOCAINE HCL (CARDIAC) 20 MG/ML IV SOLN
INTRAVENOUS | Status: DC | PRN
  Administered 2015-04-12: 40 mg via INTRAVENOUS

## 2015-04-12 MED ORDER — LIDOCAINE HCL (CARDIAC) 20 MG/ML IV SOLN
INTRAVENOUS | Status: AC
  Filled 2015-04-12: qty 5

## 2015-04-12 MED ORDER — MEDROXYPROGESTERONE ACETATE 10 MG PO TABS
20.0000 mg | ORAL_TABLET | Freq: Once | ORAL | Status: DC
  Filled 2015-04-12: qty 2

## 2015-04-12 MED ORDER — OXYCODONE-ACETAMINOPHEN 2.5-325 MG PO TABS: 1.0000 | ORAL_TABLET | Freq: Four times a day (QID) | ORAL | Status: DC | PRN

## 2015-04-12 MED ORDER — IBUPROFEN 600 MG PO TABS: 600.0000 mg | ORAL_TABLET | Freq: Four times a day (QID) | ORAL | Status: DC | PRN

## 2015-04-12 MED ORDER — PROMETHAZINE HCL 25 MG RE SUPP
RECTAL | Status: AC
  Filled 2015-04-12: qty 1

## 2015-04-12 MED ORDER — SODIUM CHLORIDE 0.9 % IV SOLN
Freq: Once | INTRAVENOUS | Status: AC
  Administered 2015-04-12: 10 mL/h via INTRAVENOUS

## 2015-04-12 MED ORDER — ONDANSETRON HCL 4 MG/2ML IJ SOLN
INTRAMUSCULAR | Status: AC
  Filled 2015-04-12: qty 2

## 2015-04-12 MED ORDER — LACTATED RINGERS IV SOLN
INTRAVENOUS | Status: DC
Start: 2015-04-12 — End: 2015-04-13
  Administered 2015-04-12: 21:00:00 via INTRAVENOUS

## 2015-04-12 MED ORDER — FENTANYL CITRATE (PF) 100 MCG/2ML IJ SOLN
INTRAMUSCULAR | Status: DC | PRN
  Administered 2015-04-12 (×2): 50 ug via INTRAVENOUS

## 2015-04-12 MED ORDER — MIDAZOLAM HCL 5 MG/5ML IJ SOLN
INTRAMUSCULAR | Status: DC | PRN
  Administered 2015-04-12: 2 mg via INTRAVENOUS

## 2015-04-12 MED ORDER — LIDOCAINE HCL 2 % IJ SOLN
INTRAMUSCULAR | Status: DC | PRN
  Administered 2015-04-12: 10 mL

## 2015-04-12 MED ORDER — FENTANYL CITRATE (PF) 100 MCG/2ML IJ SOLN
INTRAMUSCULAR | Status: AC
  Administered 2015-04-12: 50 ug via INTRAVENOUS
  Filled 2015-04-12: qty 2

## 2015-04-12 SURGICAL SUPPLY — 20 items
CATH ROBINSON RED A/P 16FR (CATHETERS) ×3 IMPLANT
CLOTH BEACON ORANGE TIMEOUT ST (SAFETY) ×3 IMPLANT
DECANTER SPIKE VIAL GLASS SM (MISCELLANEOUS) ×3 IMPLANT
GLOVE BIO SURGEON STRL SZ7.5 (GLOVE) ×3 IMPLANT
GLOVE BIOGEL PI IND STRL 7.0 (GLOVE) ×1 IMPLANT
GLOVE BIOGEL PI IND STRL 7.5 (GLOVE) ×2 IMPLANT
GLOVE BIOGEL PI INDICATOR 7.0 (GLOVE) ×2
GLOVE BIOGEL PI INDICATOR 7.5 (GLOVE) ×4
GOWN STRL REUS W/TWL LRG LVL3 (GOWN DISPOSABLE) ×6 IMPLANT
KIT BERKELEY 1ST TRIMESTER 3/8 (MISCELLANEOUS) ×3 IMPLANT
NS IRRIG 1000ML POUR BTL (IV SOLUTION) ×3 IMPLANT
PACK VAGINAL MINOR WOMEN LF (CUSTOM PROCEDURE TRAY) ×3 IMPLANT
PAD OB MATERNITY 4.3X12.25 (PERSONAL CARE ITEMS) ×3 IMPLANT
PAD PREP 24X48 CUFFED NSTRL (MISCELLANEOUS) ×3 IMPLANT
SET BERKELEY SUCTION TUBING (SUCTIONS) ×3 IMPLANT
TOWEL OR 17X24 6PK STRL BLUE (TOWEL DISPOSABLE) ×6 IMPLANT
VACURETTE 10 RIGID CVD (CANNULA) IMPLANT
VACURETTE 7MM CVD STRL WRAP (CANNULA) ×3 IMPLANT
VACURETTE 8 RIGID CVD (CANNULA) IMPLANT
VACURETTE 9 RIGID CVD (CANNULA) IMPLANT

## 2015-04-12 NOTE — MAU Note (Signed)
Patient started bleeding today changing super plus tampon every 30 minutes to an hour passing large clots, states she had a miscarriage and D&C in February.

## 2015-04-12 NOTE — H&P (Signed)
Courtney Mitchell is an 20 y.o. female. Patient presents to MAU with c/o vaginal bleeding since 1630.  Patient passing large blood clots and denies syncope, dizziness, fever, and SOB. Patient with known SAB and states she had a DnC back in Feb. [place] today reveals retained POC.     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    Past Medical History  Diagnosis Date  . Anemia     Past Surgical History  Procedure Laterality Date  . Wisdom tooth extraction    . Dilation and evacuation N/A 03/03/2015    Procedure: DILATATION AND EVACUATION;  Surgeon: Silverio Lay, MD;  Location: [facility];  Service: Gynecology;  Laterality: N/A;    Family History  Problem Relation Age of Onset  . Asthma Father   . Hypertension Father   . Cancer Other     Social History:  reports that she has never smoked. She does not have any smokeless tobacco history on file. She reports that she does not drink alcohol or use illicit drugs. Patient is single, with SO Jerrell Mylar) involved and supportive. 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  Allergies: No Known Allergies  Prescriptions prior to admission  Medication Sig Dispense Refill Last Dose  . acetaminophen (TYLENOL) 325 MG tablet Take 650 mg by mouth every 6 (six) hours as needed for mild pain or headache.    Past Month at Unknown time  . ibuprofen (ADVIL,MOTRIN) 800 MG tablet Take 800 mg by mouth every 8 (eight) hours as needed for mild pain.   04/12/2015 at 1600  . Prenatal Vit-Min-FA-Fish Oil ([employer] PRENATAL GUMMY PO) Take 3 each by mouth daily.   04/12/2015  at Unknown time  . ibuprofen (ADVIL,MOTRIN) 600 MG tablet Take 1 tablet (600 mg total) by mouth every 6 (six) hours as needed. (Patient not taking: Reported on 04/12/2015) 30 tablet 0     ROS  Blood pressure 136/80, pulse 96, temperature 98.9 F (37.2 C), resp. rate 18, last menstrual period 03/07/2015, unknown if currently breastfeeding. Physical Exam  Vitals reviewed. Constitutional: She is oriented to person, place, and time. She appears well-developed and well-nourished. No distress.  HENT:  Head: Normocephalic and atraumatic.  Eyes: Conjunctivae are normal.  Neck: Normal range of motion.  Cardiovascular: Normal rate, regular rhythm and normal heart sounds.   Respiratory: Effort normal and breath sounds normal.  GI: Soft. Bowel sounds are normal. She exhibits no distension.  Musculoskeletal: Normal range of motion. She exhibits no edema.  Neurological: She is alert and oriented to person, place, and time.  Skin: Skin is warm and dry.  Psychiatric: She has a normal mood and affect. Her behavior is normal.    Results for orders placed or performed during the hospital encounter of 04/12/15 (from the past 24 hour(s))  Pregnancy, urine POC     Status: None   Collection Time: 04/12/15  5:11 PM  Result Value Ref Range   Preg Test, Ur NEGATIVE NEGATIVE  CBC     Status: Abnormal   Collection Time: 04/12/15  5:37 PM  Result Value Ref Range   WBC 7.3 4.0 - 10.5 K/uL  RBC 3.95 3.87 - 5.11 MIL/uL   Hemoglobin 11.4 (L) 12.0 - 15.0 g/dL   HCT 69.633.9 (L) 29.536.0 - 28.446.0 %   MCV 85.8 78.0 - 100.0 fL   MCH 28.9 26.0 - 34.0 pg   MCHC 33.6 30.0 - 36.0 g/dL   RDW 13.213.5 44.011.5 - 10.215.5 %   Platelets 306 150 - 400 K/uL    [place] Transvaginal Non-ob  04/12/2015  CLINICAL DATA:  Heavy vaginal bleeding. Post spontaneous fetal demise 6 weeks prior. EXAM: TRANSABDOMINAL AND TRANSVAGINAL ULTRASOUND OF PELVIS TECHNIQUE: Both transabdominal and transvaginal ultrasound examinations of the pelvis were performed.  Transabdominal technique was performed for global imaging of the pelvis including uterus, ovaries, adnexal regions, and pelvic cul-de-sac. It was necessary to proceed with endovaginal exam following the transabdominal exam to visualize the endometrium, left and right ovary. COMPARISON:  None FINDINGS: Uterus Measurements: 10.2 x 4.4 x 5.2 cm. No fibroids or other mass visualized. Endometrium Thickness: 21 mm. The endometrium is diffusely heterogeneous with heterogeneous fluid in the endometrial canal. There is questionable internal blood flow about the periphery. Right ovary Measurements: 2.7 x 1.7 x 2.3 cm. Normal appearance/no adnexal mass. There are physiologic follicles. Blood flow is noted. Left ovary Measurements: 3.2 x 2.3 x 2.2 cm. Normal appearance/no adnexal mass. There are physiologic follicles. Blood flow is noted. Other findings No abnormal free fluid. IMPRESSION: Heterogeneous endometrial thickening with heterogeneous fluid in the endometrial canal, question of internal blood flow. Findings are concerning for retained products of conception given failed pregnancy 6 weeks prior. Electronically Signed   By: Rubye OaksMelanie  Ehinger M.D.   On: 04/12/2015 18:58   [place] Pelvis Complete  04/12/2015  CLINICAL DATA:  Heavy vaginal bleeding. Post spontaneous fetal demise 6 weeks prior. EXAM: TRANSABDOMINAL AND TRANSVAGINAL ULTRASOUND OF PELVIS TECHNIQUE: Both transabdominal and transvaginal ultrasound examinations of the pelvis were performed. Transabdominal technique was performed for global imaging of the pelvis including uterus, ovaries, adnexal regions, and pelvic cul-de-sac. It was necessary to proceed with endovaginal exam following the transabdominal exam to visualize the endometrium, left and right ovary. COMPARISON:  None FINDINGS: Uterus Measurements: 10.2 x 4.4 x 5.2 cm. No fibroids or other mass visualized. Endometrium Thickness: 21 mm. The endometrium is diffusely heterogeneous with heterogeneous fluid in  the endometrial canal. There is questionable internal blood flow about the periphery. Right ovary Measurements: 2.7 x 1.7 x 2.3 cm. Normal appearance/no adnexal mass. There are physiologic follicles. Blood flow is noted. Left ovary Measurements: 3.2 x 2.3 x 2.2 cm. Normal appearance/no adnexal mass. There are physiologic follicles. Blood flow is noted. Other findings No abnormal free fluid. IMPRESSION: Heterogeneous endometrial thickening with heterogeneous fluid in the endometrial canal, question of internal blood flow. Findings are concerning for retained products of conception given failed pregnancy 6 weeks prior. Electronically Signed   By: Rubye OaksMelanie  Ehinger M.D.   On: 04/12/2015 18:58    Assessment/Plan: 19y.o. Female Vaginal Bleeding Retained Products of Conception O Positive  [facility] Pre-Op Orders R&B of D&E reviewed 2U PRBC on hold  Cherre RobinsJessica L Emly MSN, CNM 04/12/2015, 7:35 PM

## 2015-04-12 NOTE — Anesthesia Preprocedure Evaluation (Addendum)
Anesthesia Evaluation  Patient identified by MRN, date of birth, ID band Patient awake    Reviewed: Allergy & Precautions, NPO status , Patient's Chart, lab work & pertinent test results  Airway Mallampati: I  TM Distance: >3 FB Neck ROM: Full    Dental   Pulmonary neg pulmonary ROS,    Pulmonary exam normal        Cardiovascular negative cardio ROS   Rhythm:Regular Rate:Normal     Neuro/Psych negative neurological ROS     GI/Hepatic negative GI ROS, Neg liver ROS,   Endo/Other  negative endocrine ROS  Renal/GU negative Renal ROS     Musculoskeletal   Abdominal   Peds  Hematology  (+) anemia ,   Anesthesia Other Findings   Reproductive/Obstetrics                            Lab Results  Component Value Date   WBC 7.3 04/12/2015   HGB 11.4* 04/12/2015   HCT 33.9* 04/12/2015   MCV 85.8 04/12/2015   PLT 306 04/12/2015   Lab Results  Component Value Date   CREATININE 0.9 02/04/2012   BUN 14.6 02/04/2012   NA 140 02/04/2012   K 3.8 02/04/2012   CL 108* 02/04/2012   CO2 23 02/04/2012    Anesthesia Physical Anesthesia Plan  ASA: II  Anesthesia Plan: MAC   Post-op Pain Management:    Induction: Intravenous  Airway Management Planned: Natural Airway and Simple Face Mask  Additional Equipment:   Intra-op Plan:   Post-operative Plan:   Informed Consent: I have reviewed the patients History and Physical, chart, labs and discussed the procedure including the risks, benefits and alternatives for the proposed anesthesia with the patient or authorized representative who has indicated his/her understanding and acceptance.     Plan Discussed with: CRNA  Anesthesia Plan Comments:        Anesthesia Quick Evaluation

## 2015-04-12 NOTE — Discharge Instructions (Signed)
DISCHARGE INSTRUCTIONS: D&C / D&E The following instructions have been prepared to help you care for yourself upon your return home.   Personal hygiene:  Use sanitary pads for vaginal drainage, not tampons.  Shower the day after your procedure.  NO tub baths, pools or Jacuzzis for 2-3 weeks.  Wipe front to back after using the bathroom.  Activity and limitations:  Do NOT drive or operate any equipment for 24 hours. The effects of anesthesia are still present and drowsiness may result.  Do NOT rest in bed all day.  Walking is encouraged.  Walk up and down stairs slowly.  You may resume your normal activity in one to two days or as indicated by your physician.  Sexual activity: NO intercourse for at least 2 weeks after the procedure, or as indicated by your physician.  Diet: Eat a light meal as desired this evening. You may resume your usual diet tomorrow.  Return to work: You may resume your work activities in one to two days or as indicated by your doctor.  What to expect after your surgery: Expect to have vaginal bleeding/discharge for 2-3 days and spotting for up to 10 days. It is not unusual to have soreness for up to 1-2 weeks. You may have a slight burning sensation when you urinate for the first day. Mild cramps may continue for a couple of days. You may have a regular period in 2-6 weeks.  Call your doctor for any of the following:  Excessive vaginal bleeding, saturating and changing one pad every hour.  Inability to urinate 6 hours after discharge from hospital.  Pain not relieved by pain medication.  Fever of 100.4 F or greater.  Unusual vaginal discharge or odor.   Call for an appointment:    Patients signature: ______________________  Nurses signature ________________________  Support person's signature_______________________  Can have motrin/advil/ ibuprofin starting at 2:15AM Can use a heating pad to abdomen. Increase water intake the next 48  hours.  Take 2 iron tablets a day with a stool softener

## 2015-04-12 NOTE — MAU Provider Note (Signed)
Courtney Mitchell is a 20 y.o. G2P0020 no pregnant SP SAB & D&C in February 2017.  She state she has had a period since the loss. But today she c/o heavy vaginal bleeding, passing large clots and has a history of anemia.   History     Patient Active Problem List   Diagnosis Date Noted  . Missed abortion 03/01/2015  . Heart murmur 03/01/2015  . History of 1 spontaneous abortion 03/01/2015  . 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 Menorrhagia 08/03/2011    Chief Complaint  Patient presents with  . Vaginal Bleeding   HPI  OB History as of 04/11/15    Gravida Para Term Preterm AB TAB SAB Ectopic Multiple Living   1 0 0 0 0 0 0 0 0 0       Past Medical History  Diagnosis Date  . Anemia     Past Surgical History  Procedure Laterality Date  . Wisdom tooth extraction    . Dilation and evacuation N/A 03/03/2015    Procedure: DILATATION AND EVACUATION;  Surgeon: Silverio LaySandra Rivard, MD;  Location: [facility];  Service: Gynecology;  Laterality: N/A;    Family History  Problem Relation Age of Onset  . Asthma Father   . Hypertension Father   . Cancer Other     Social History  Substance Use Topics  . Smoking status: Never Smoker   . Smokeless tobacco: None  . Alcohol Use: No    Allergies: No Known Allergies  Prescriptions prior to admission  Medication Sig Dispense Refill Last Dose  . acetaminophen (TYLENOL) 325 MG tablet Take 650 mg by mouth every 6 (six) hours as needed for mild pain or headache.    Past Month at Unknown time  . ibuprofen (ADVIL,MOTRIN) 800 MG tablet Take 800 mg by mouth every 8 (eight) hours as needed for mild pain.   04/12/2015 at 1600  . Prenatal Vit-Min-FA-Fish Oil ([employer] PRENATAL GUMMY PO) Take 3 each by mouth daily.   04/12/2015 at Unknown time  . ibuprofen (ADVIL,MOTRIN) 600 MG tablet Take 1 tablet (600 mg total) by mouth every 6 (six) hours as needed. (Patient not taking: Reported on 04/12/2015) 30 tablet 0     ROS See HPI above, all other systems are  negative  Physical Exam   Blood pressure 136/80, pulse 96, temperature 98.9 F (37.2 C), resp. rate 18, last menstrual period 03/07/2015, unknown if currently breastfeeding.  Physical Exam Ext:  WNL ABD: Soft, non tender to palpation, no rebound or guarding SVE:   ED Course  Assessment: Unexplained heavy vaginal bleeding   Plan: Labs: CBC, Pelvic US   Venus Standard, CNM, MSN 04/12/2015. 5:55 PM  Incomplete, please see H&P.

## 2015-04-12 NOTE — Transfer of Care (Signed)
Immediate Anesthesia Transfer of Care Note  Patient: Courtney Mitchell  Procedure(s) Performed: Procedure(s): DILATATION AND EVACUATION (N/A)  Patient Location: PACU  Anesthesia Type:MAC  Level of Consciousness: awake  Airway & Oxygen Therapy: Patient Spontanous Breathing and Patient connected to nasal cannula oxygen  Post-op Assessment: Report given to RN and Post -op Vital signs reviewed and stable  Post vital signs: stable  Last Vitals:  Filed Vitals:   04/12/15 1713  BP: 136/80  Pulse: 96  Temp: 37.2 C  Resp: 18    Complications: No apparent anesthesia complications

## 2015-04-12 NOTE — Op Note (Signed)
Preop Diagnosis: Retained Products of Conception  Postop Diagnosis: Retained Products of Conception   Procedure: SUCTION DILATION and CURETTAGE   Anesthesia: Monitor Anesthesia Care   Anesthesiologist: Marcene Duosobert Fitzgerald, MD   Attending: Osborn CohoAngela Maikel Neisler, MD   Assistant: N/a  Findings: Minimal tissue returned.  Minimal bleeding at end of procedure.  Pathology: Retained POCs  Fluids: 2800 ml  UOP: 100 ml via straight cath prior to procedure  EBL: 100 ml of clot and blood at start of procedure  Complications: None  Procedure: The patient was taken to the operating room after the risks benefits and alternatives were discussed with the patient, the patient verbalized understanding and consent signed and witnessed.  The patient was placed under MAC anesthesia, prepped and draped in the normal sterile fashion and a time out was performed.  A bivalve speculum was placed in the patient's vagina and the anterior lip of the cervix grasped with a single-tooth tenaculum. A paracervical block was administered using a total of 10 cc of 2% lidocaine.  The uterus was sounded to 8 cm and a size 7 suction curette was used. Suction curettage was performed until minimal tissue returned. Sharp curettage was performed until a gritty texture was noted. Suction curettage was performed once again to remove any remaining debris. All instruments were removed. The count was correct. The patient was transferred to the recovery room in good condition.

## 2015-04-13 LAB — ABO/RH: ABO/RH(D): O POS

## 2015-04-13 NOTE — Anesthesia Postprocedure Evaluation (Signed)
Anesthesia Post Note  Patient: Courtney Mitchell  Procedure(s) Performed: Procedure(s) (LRB): DILATATION AND EVACUATION (N/A)  Patient location during evaluation: PACU Anesthesia Type: MAC Level of consciousness: awake and alert Pain management: pain level controlled Vital Signs Assessment: post-procedure vital signs reviewed and stable Respiratory status: spontaneous breathing, nonlabored ventilation, respiratory function stable and patient connected to nasal cannula oxygen Cardiovascular status: stable and blood pressure returned to baseline Anesthetic complications: no    Last Vitals:  Filed Vitals:   04/12/15 2217 04/12/15 2310  BP:  122/78  Pulse: 74 74  Temp:  36.8 C  Resp: 19 20    Last Pain:  Filed Vitals:   04/12/15 2318  PainSc: 1                  Kennieth RadFitzgerald, Arrow Tomko E

## 2015-04-14 ENCOUNTER — Encounter (HOSPITAL_COMMUNITY): Payer: Self-pay | Admitting: Obstetrics and Gynecology

## 2015-04-16 LAB — TYPE AND SCREEN
ABO/RH(D): O POS
Antibody Screen: NEGATIVE
UNIT DIVISION: 0
UNIT DIVISION: 0

## 2015-06-25 ENCOUNTER — Inpatient Hospital Stay (HOSPITAL_COMMUNITY)
Admission: AD | Admit: 2015-06-25 | Discharge: 2015-06-25 | Disposition: A | Discharge status: Home / Self Care | Payer: Medicaid Other | Source: Ambulatory Visit | Attending: Obstetrics | Admitting: Obstetrics

## 2015-06-25 ENCOUNTER — Inpatient Hospital Stay (HOSPITAL_COMMUNITY): Payer: Medicaid Other

## 2015-06-25 ENCOUNTER — Encounter (HOSPITAL_COMMUNITY): Payer: Self-pay | Admitting: *Deleted

## 2015-06-25 DIAGNOSIS — R102 Pelvic and perineal pain: Secondary | ICD-10-CM | POA: Diagnosis not present

## 2015-06-25 DIAGNOSIS — O26891 Other specified pregnancy related conditions, first trimester: Secondary | ICD-10-CM | POA: Insufficient documentation

## 2015-06-25 DIAGNOSIS — O98311 Other infections with a predominantly sexual mode of transmission complicating pregnancy, first trimester: Secondary | ICD-10-CM | POA: Diagnosis not present

## 2015-06-25 DIAGNOSIS — Z3A01 Less than 8 weeks gestation of pregnancy: Secondary | ICD-10-CM | POA: Insufficient documentation

## 2015-06-25 DIAGNOSIS — O9989 Other specified diseases and conditions complicating pregnancy, childbirth and the puerperium: Secondary | ICD-10-CM | POA: Diagnosis not present

## 2015-06-25 DIAGNOSIS — A599 Trichomoniasis, unspecified: Secondary | ICD-10-CM

## 2015-06-25 DIAGNOSIS — R109 Unspecified abdominal pain: Secondary | ICD-10-CM | POA: Diagnosis not present

## 2015-06-25 DIAGNOSIS — O26899 Other specified pregnancy related conditions, unspecified trimester: Secondary | ICD-10-CM

## 2015-06-25 DIAGNOSIS — A5901 Trichomonal vulvovaginitis: Secondary | ICD-10-CM | POA: Insufficient documentation

## 2015-06-25 DIAGNOSIS — R1032 Left lower quadrant pain: Secondary | ICD-10-CM | POA: Diagnosis present

## 2015-06-25 LAB — WET PREP, GENITAL
SPERM: NONE SEEN
YEAST WET PREP: NONE SEEN

## 2015-06-25 LAB — CBC
HEMATOCRIT: 32 % — AB (ref 36.0–46.0)
HEMOGLOBIN: 10.4 g/dL — AB (ref 12.0–15.0)
MCH: 25.2 pg — ABNORMAL LOW (ref 26.0–34.0)
MCHC: 32.5 g/dL (ref 30.0–36.0)
MCV: 77.5 fL — ABNORMAL LOW (ref 78.0–100.0)
Platelets: 335 10*3/uL (ref 150–400)
RBC: 4.13 MIL/uL (ref 3.87–5.11)
RDW: 14.7 % (ref 11.5–15.5)
WBC: 8.4 10*3/uL (ref 4.0–10.5)

## 2015-06-25 LAB — URINALYSIS, ROUTINE W REFLEX MICROSCOPIC
BILIRUBIN URINE: NEGATIVE
GLUCOSE, UA: NEGATIVE mg/dL
HGB URINE DIPSTICK: NEGATIVE
KETONES UR: NEGATIVE mg/dL
Leukocytes, UA: NEGATIVE
Nitrite: NEGATIVE
PH: 6 (ref 5.0–8.0)
PROTEIN: NEGATIVE mg/dL
Specific Gravity, Urine: 1.025 (ref 1.005–1.030)

## 2015-06-25 LAB — POCT PREGNANCY, URINE: Preg Test, Ur: POSITIVE — AB

## 2015-06-25 LAB — HCG, QUANTITATIVE, PREGNANCY: hCG, Beta Chain, Quant, S: 25870 m[IU]/mL — ABNORMAL HIGH (ref <5)

## 2015-06-25 MED ORDER — METRONIDAZOLE 500 MG PO TABS
2000.0000 mg | ORAL_TABLET | Freq: Once | ORAL | Status: AC
  Administered 2015-06-25: 2000 mg via ORAL
  Filled 2015-06-25: qty 4

## 2015-06-25 NOTE — MAU Note (Addendum)
Pos HPT also verified by [facility] last week.  Had miscarriage in March, had D&C.  Began having sharp pain in lower abdomen last night.  Pain continues today.  Denies bleeding.  Pt states her temp was 102 @ 1500 today, did not take any meds.

## 2015-06-25 NOTE — Discharge Instructions (Signed)
Trichomoniasis °Trichomoniasis is an infection caused by an organism called Trichomonas. The infection can affect both women and men. In women, the outer female genitalia and the vagina are affected. In men, the penis is mainly affected, but the prostate and other reproductive organs can also be involved. Trichomoniasis is a sexually transmitted infection (STI) and is most often passed to another person through sexual contact.  °RISK FACTORS °· Having unprotected sexual intercourse. °· Having sexual intercourse with an infected partner. °SIGNS AND SYMPTOMS  °Symptoms of trichomoniasis in women include: °· Abnormal gray-green frothy vaginal discharge. °· Itching and irritation of the vagina. °· Itching and irritation of the area outside the vagina. °Symptoms of trichomoniasis in men include:  °· Penile discharge with or without pain. °· Pain during urination. This results from inflammation of the urethra. °DIAGNOSIS  °Trichomoniasis may be found during a Pap test or physical exam. Your health care provider may use one of the following methods to help diagnose this infection: °· Testing the pH of the vagina with a test tape. °· Using a vaginal swab test that checks for the Trichomonas organism. A test is available that provides results within a few minutes. °· Examining a urine sample. °· Testing vaginal secretions. °Your health care provider may test you for other STIs, including HIV. °TREATMENT  °· You may be given medicine to fight the infection. Women should inform their health care provider if they could be or are pregnant. Some medicines used to treat the infection should not be taken during pregnancy. °· Your health care provider may recommend over-the-counter medicines or creams to decrease itching or irritation. °· Your sexual partner will need to be treated if infected. °· Your health care provider may test you for infection again 3 months after treatment. °HOME CARE INSTRUCTIONS  °· Take medicines only as  directed by your health care provider. °· Take over-the-counter medicine for itching or irritation as directed by your health care provider. °· Do not have sexual intercourse while you have the infection. °· Women should not douche or wear tampons while they have the infection. °· Discuss your infection with your partner. Your partner may have gotten the infection from you, or you may have gotten it from your partner. °· Have your sex partner get examined and treated if necessary. °· Practice safe, informed, and protected sex. °· See your health care provider for other STI testing. °SEEK MEDICAL CARE IF:  °· You still have symptoms after you finish your medicine. °· You develop abdominal pain. °· You have pain when you urinate. °· You have bleeding after sexual intercourse. °· You develop a rash. °· Your medicine makes you sick or makes you throw up (vomit). °MAKE SURE YOU: °· Understand these instructions. °· Will watch your condition. °· Will get help right away if you are not doing well or get worse. °  °This information is not intended to replace advice given to you by your health care provider. Make sure you discuss any questions you have with your health care provider. °  °Document Released: 06/16/2000 Document Revised: 01/11/2014 Document Reviewed: 10/02/2012 °Elsevier Interactive Patient Education ©2016 Elsevier Inc. ° ° ° °Abdominal Pain During Pregnancy °Abdominal pain is common in pregnancy. Most of the time, it does not cause harm. There are many causes of abdominal pain. Some causes are more serious than others. Some of the causes of abdominal pain in pregnancy are easily diagnosed. Occasionally, the diagnosis takes time to understand. Other times, the cause is   pain can be a sign that something is very wrong with the pregnancy, or the pain may have nothing to do with the pregnancy at all. For this reason, always tell your health care provider if you have any abdominal  discomfort. HOME CARE INSTRUCTIONS  Monitor your abdominal pain for any changes. The following actions may help to alleviate any discomfort you are experiencing:  Do not have sexual intercourse or put anything in your vagina until your symptoms go away completely.  Get plenty of rest until your pain improves.  Drink clear fluids if you feel nauseous. Avoid solid food as long as you are uncomfortable or nauseous.  Only take over-the-counter or prescription medicine as directed by your health care provider.  Keep all follow-up appointments with your health care provider. SEEK IMMEDIATE MEDICAL CARE IF:  You are bleeding, leaking fluid, or passing tissue from the vagina.  You have increasing pain or cramping.  You have persistent vomiting.  You have painful or bloody urination.  You have a fever.  You notice a decrease in your baby's movements.  You have extreme weakness or feel faint.  You have shortness of breath, with or without abdominal pain.  You develop a severe headache with abdominal pain.  You have abnormal vaginal discharge with abdominal pain.  You have persistent diarrhea.  You have abdominal pain that continues even after rest, or gets worse. MAKE SURE YOU:   Understand these instructions.  Will watch your condition.  Will get help right away if you are not doing well or get worse.   This information is not intended to replace advice given to you by your health care provider. Make sure you discuss any questions you have with your health care provider.   Document Released: 12/21/2004 Document Revised: 10/11/2012 Document Reviewed: 07/20/2012 Elsevier Interactive Patient Education [employer]2016 Elsevier Inc.

## 2015-06-25 NOTE — MAU Provider Note (Signed)
History     CSN: [id number]  Arrival date and time: 06/25/15 1640  First Provider Initiated Contact with Patient 06/25/15 1804      Chief Complaint  Patient presents with  . Abdominal Pain   HPI Comments: [id number] @[redacted]w[redacted]d  by sure LMP c/o LAP since last night. She describes it as intermittent and worse on left side. She denies urinary sx. She reports white vaginal discharge with odor x1 week. She also reports a fever of 102 about 3 hrs ago. She did not take any Tylenol. She has a hx of MAB x2 and D&C. She reports a quant HCG result 2 days ago of 2300.  Abdominal Pain Associated symptoms include a fever.    OB History    Gravida Para Term Preterm AB TAB SAB Ectopic Multiple Living   3 0 0 0 2 0 2 0 0 0       Past Medical History  Diagnosis Date  . Anemia     Past Surgical History  Procedure Laterality Date  . Wisdom tooth extraction    . Dilation and evacuation N/A 03/03/2015    Procedure: DILATATION AND EVACUATION;  Surgeon: Silverio LaySandra Rivard, MD;  Location: [facility];  Service: Gynecology;  Laterality: N/A;  . Dilation and evacuation N/A 04/12/2015    Procedure: DILATATION AND EVACUATION;  Surgeon: Osborn CohoAngela Roberts, MD;  Location: [facility];  Service: Gynecology;  Laterality: N/A;    Family History  Problem Relation Age of Onset  . Asthma Father   . Hypertension Father   . Cancer Other     Social History  Substance Use Topics  . Smoking status: Never Smoker   . Smokeless tobacco: Never Used  . Alcohol Use: No    Allergies: No Known Allergies  Prescriptions prior to admission  Medication Sig Dispense Refill Last Dose  . prenatal vitamin w/FE, FA (NATACHEW) 29-1 MG CHEW chewable tablet Chew 3 tablets by mouth daily at 12 noon.   06/25/2015 at Unknown time  . ferrous sulfate 325 (65 FE) MG tablet Take 1 tablet (325 mg total) by mouth daily with breakfast. (Patient not taking: Reported on 06/25/2015) 30 tablet 1 Not Taking at Unknown time  . ibuprofen (ADVIL,MOTRIN) 600 MG tablet  Take 1 tablet (600 mg total) by mouth every 6 (six) hours as needed. (Patient not taking: Reported on 06/25/2015) 30 tablet 0 Not Taking at Unknown time  . oxycodone-acetaminophen (PERCOCET) 2.5-325 MG tablet Take 1-2 tablets by mouth every 6 (six) hours as needed for pain. (Patient not taking: Reported on 06/25/2015) 30 tablet 0 Not Taking at Unknown time    Review of Systems  Constitutional: Positive for fever. Negative for chills.  HENT: Negative.   Respiratory: Negative.   Cardiovascular: Negative.   Gastrointestinal: Positive for abdominal pain.  Genitourinary: Negative.   Musculoskeletal: Negative.   Neurological: Negative.    Physical Exam   Blood pressure 124/79, pulse 87, temperature 98.4 F (36.9 C), temperature source Oral, resp. rate 18, height 5\' 5"  (1.651 m), weight 186 lb (84.369 kg), last menstrual period 05/07/2015, unknown if currently breastfeeding.  Physical Exam  Constitutional: She is oriented to person, place, and time. She appears well-developed and well-nourished.  HENT:  Head: Normocephalic and atraumatic.  Neck: Normal range of motion. Neck supple.  Cardiovascular: Normal rate.   Respiratory: Effort normal.  GI: Soft. She exhibits no distension and no mass. There is tenderness (LLQ). There is no rebound and no guarding.  Genitourinary: Vagina normal and uterus normal.  External: No  lesions Vagina: rugated, nulli, cervix closed, thick white discharge Uterus: slightly enlarged, anteverted, non tender Adnexae: no masses, no tenderness left, no tenderness right   Musculoskeletal: Normal range of motion.  Neurological: She is alert and oriented to person, place, and time.  Skin: Skin is warm and dry.  Psychiatric: She has a normal mood and affect.    MAU Course  Procedures Results for orders placed or performed during the hospital encounter of 06/25/15 (from the past 24 hour(s))  Urinalysis, Routine w reflex microscopic (not at [facility])     Status: Abnormal    Collection Time: 06/25/15  5:07 PM  Result Value Ref Range   Color, Urine YELLOW YELLOW   APPearance HAZY (A) CLEAR   Specific Gravity, Urine 1.025 1.005 - 1.030   pH 6.0 5.0 - 8.0   Glucose, UA NEGATIVE NEGATIVE mg/dL   Hgb urine dipstick NEGATIVE NEGATIVE   Bilirubin Urine NEGATIVE NEGATIVE   Ketones, ur NEGATIVE NEGATIVE mg/dL   Protein, ur NEGATIVE NEGATIVE mg/dL   Nitrite NEGATIVE NEGATIVE   Leukocytes, UA NEGATIVE NEGATIVE  Pregnancy, urine POC     Status: Abnormal   Collection Time: 06/25/15  5:31 PM  Result Value Ref Range   Preg Test, Ur POSITIVE (A) NEGATIVE  Wet prep, genital     Status: Abnormal   Collection Time: 06/25/15  6:08 PM  Result Value Ref Range   Yeast Wet Prep HPF POC NONE SEEN NONE SEEN   Trich, Wet Prep PRESENT (A) NONE SEEN   Clue Cells Wet Prep HPF POC PRESENT (A) NONE SEEN   WBC, Wet Prep HPF POC MODERATE (A) NONE SEEN   Sperm NONE SEEN   hCG, quantitative, pregnancy     Status: Abnormal   Collection Time: 06/25/15  6:11 PM  Result Value Ref Range   hCG, Beta Chain, Quant, S [address] (H) <5 mIU/mL  CBC     Status: Abnormal   Collection Time: 06/25/15  6:11 PM  Result Value Ref Range   WBC 8.4 4.0 - 10.5 K/uL   RBC 4.13 3.87 - 5.11 MIL/uL   Hemoglobin 10.4 (L) 12.0 - 15.0 g/dL   HCT 60.4 (L) 54.0 - 98.1 %   MCV 77.5 (L) 78.0 - 100.0 fL   MCH 25.2 (L) 26.0 - 34.0 pg   MCHC 32.5 30.0 - 36.0 g/dL   RDW 19.1 47.8 - 29.5 %   Platelets 335 150 - 400 K/uL   US Ob Comp Less 14 Wks  06/25/2015  CLINICAL DATA:  Abdominal pain. Vaginal bleeding. Status post spontaneous abortion in February. Seven weeks 0 days pregnant by last menstrual. Beta HCG pending. EXAM: OBSTETRIC <14 WK [place] AND TRANSVAGINAL OB US TECHNIQUE: Both transabdominal and transvaginal ultrasound examinations were performed for complete evaluation of the gestation as well as the maternal uterus, adnexal regions, and pelvic cul-de-sac. Transvaginal technique was performed to assess early  pregnancy. COMPARISON:  04/12/2015 FINDINGS: Intrauterine gestational sac: Present Yolk sac:  Present Embryo:  Present Cardiac Activity: Present Heart Rate: 90  bpm CRL:  2  mm   5 w   5 d                  [place] EDC: 02/20/2016 Subchorionic hemorrhage:  Absent Maternal uterus/adnexae: Normal appearance of the ovaries. No adnexal mass. No significant free fluid. IMPRESSION: Intrauterine pregnancy of approximately 5 weeks 5 days with fetal heart rate of 90 beats per minute. Electronically Signed   By: Hosie Spangle.D.  On: 06/25/2015 19:15   US Ob Transvaginal  06/25/2015  CLINICAL DATA:  Abdominal pain. Vaginal bleeding. Status post spontaneous abortion in February. Seven weeks 0 days pregnant by last menstrual. Beta HCG pending. EXAM: OBSTETRIC <14 WK [place] AND TRANSVAGINAL OB US TECHNIQUE: Both transabdominal and transvaginal ultrasound examinations were performed for complete evaluation of the gestation as well as the maternal uterus, adnexal regions, and pelvic cul-de-sac. Transvaginal technique was performed to assess early pregnancy. COMPARISON:  04/12/2015 FINDINGS: Intrauterine gestational sac: Present Yolk sac:  Present Embryo:  Present Cardiac Activity: Present Heart Rate: 90  bpm CRL:  2  mm   5 w   5 d                  [place] EDC: 02/20/2016 Subchorionic hemorrhage:  Absent Maternal uterus/adnexae: Normal appearance of the ovaries. No adnexal mass. No significant free fluid. IMPRESSION: Intrauterine pregnancy of approximately 5 weeks 5 days with fetal heart rate of 90 beats per minute. Electronically Signed   By: Jeronimo Greaves M.D.   On: 06/25/2015 19:15    MDM Labs reviewed. No evidence of UTI or acute abdomen. Will offer expedited partner tx for trich. Discussed A/P with Dr. Clearance Coots. Stable for discharge home.  Assessment and Plan  Live IUP @[redacted]w[redacted]d  Pelvic pain in pregnancy Trichimoniasis  Discharge home Continue PNV daily Abstain from IC or use condoms Follow up in 2-3 weeks at [facility] to  schedule appt Return to MAU for worsening pain    Donette Larry 06/25/2015, 6:11 PM

## 2015-06-26 LAB — GC/CHLAMYDIA PROBE AMP (~~LOC~~) NOT AT ARMC
Chlamydia: NEGATIVE
Neisseria Gonorrhea: NEGATIVE

## 2015-07-15 ENCOUNTER — Encounter: Payer: Self-pay | Admitting: Obstetrics and Gynecology

## 2015-07-24 ENCOUNTER — Encounter: Payer: Self-pay | Admitting: Obstetrics and Gynecology

## 2015-07-24 ENCOUNTER — Ambulatory Visit (INDEPENDENT_AMBULATORY_CARE_PROVIDER_SITE_OTHER): Payer: Medicaid Other | Admitting: Obstetrics and Gynecology

## 2015-07-24 VITALS — BP 129/80 | HR 86 | Wt 183.0 lb

## 2015-07-24 DIAGNOSIS — O23591 Infection of other part of genital tract in pregnancy, first trimester: Secondary | ICD-10-CM

## 2015-07-24 DIAGNOSIS — Z3491 Encounter for supervision of normal pregnancy, unspecified, first trimester: Secondary | ICD-10-CM

## 2015-07-24 DIAGNOSIS — Z6831 Body mass index (BMI) 31.0-31.9, adult: Secondary | ICD-10-CM | POA: Diagnosis not present

## 2015-07-24 DIAGNOSIS — O99210 Obesity complicating pregnancy, unspecified trimester: Secondary | ICD-10-CM | POA: Diagnosis not present

## 2015-07-24 DIAGNOSIS — Z3481 Encounter for supervision of other normal pregnancy, first trimester: Secondary | ICD-10-CM | POA: Diagnosis not present

## 2015-07-24 DIAGNOSIS — Z3490 Encounter for supervision of normal pregnancy, unspecified, unspecified trimester: Secondary | ICD-10-CM | POA: Insufficient documentation

## 2015-07-24 DIAGNOSIS — R011 Cardiac murmur, unspecified: Secondary | ICD-10-CM

## 2015-07-24 DIAGNOSIS — A5901 Trichomonal vulvovaginitis: Secondary | ICD-10-CM | POA: Insufficient documentation

## 2015-07-24 DIAGNOSIS — O23599 Infection of other part of genital tract in pregnancy, unspecified trimester: Secondary | ICD-10-CM

## 2015-07-24 MED ORDER — BREAST PUMP MISC: Status: DC

## 2015-07-24 MED ORDER — FERROUS GLUCONATE 324 (38 FE) MG PO TABS: 324.0000 mg | ORAL_TABLET | Freq: Every day | ORAL | Status: DC

## 2015-07-24 NOTE — Progress Notes (Signed)
New OB Note  07/24/2015   Clinic: [facility]  Chief Complaint: New OB  Transfer of Care Patient: no  History of Present Illness: Ms. Courtney Mitchell is a 20 y.o. G3P0020 @ 9/6 weeks (EDC 2/16, based on 5wk u/s;  Patient's last menstrual period was 05/06/2015.), with the above CC. Preg complicated by has Unspecified deficiency anemia; Heart murmur; Supervision of normal pregnancy in first trimester; Trichomonal vaginitis during pregnancy; BMI 31.0-31.9,adult; and Obesity in pregnancy on her problem list. h/o AB x 2  Her periods were: regular, qmonth She was using no method when she conceived.  She has Negative signs or symptoms of nausea/vomiting of pregnancy. She has Negative signs or symptoms of miscarriage or preterm labor  ROS: A 12-point review of systems was performed and negative, except as stated in the above HPI.  OBGYN History: As per HPI. OB History  Gravida Para Term Preterm AB SAB TAB Ectopic Multiple Living  3 0 0 0 2 2 0 0 0 0     # Outcome Date GA Lbr Len/2nd Weight Sex Delivery Anes PTL Lv  3 Current           2 SAB           1 SAB               Any prior children are healthy, doing well, without any problems or issues: not applicable History of pap smears: No. History of STIs: Yes   Past Medical History: Past Medical History  Diagnosis Date  . Anemia   . Menorrhagia 08/03/2011  . Heart murmur 03/01/2015    Past Surgical History: Past Surgical History  Procedure Laterality Date  . Wisdom tooth extraction    . Dilation and evacuation N/A 03/03/2015    Procedure: DILATATION AND EVACUATION;  Surgeon: Silverio LaySandra Rivard, MD;  Location: [facility];  Service: Gynecology;  Laterality: N/A;  . Dilation and evacuation N/A 04/12/2015    Procedure: DILATATION AND EVACUATION;  Surgeon: Osborn CohoAngela Roberts, MD;  Location: [facility];  Service: Gynecology;  Laterality: N/A;    Family History:  Family History  Problem Relation Age of Onset  . Asthma Father   . Hypertension Father   . Cancer Other     She denies any female cancers, bleeding or blood clotting disorders.  She denies any history of mental retardation, birth defects or genetic disorders in her or the FOB's history  Social History:  Social History   Social History  . Marital Status: Single    Spouse Name: N/A  . Number of Children: N/A  . Years of Education: N/A   Occupational History  . Not on file.   Social History Main Topics  . Smoking status: Never Smoker   . Smokeless tobacco: Never Used  . Alcohol Use: No  . Drug Use: No  . Sexual Activity: Yes    Birth Control/ Protection: Implant     Comment: Nexplanon 04/2010   Other Topics Concern  . Not on file   Social History Narrative    Allergy: No Known Allergies  Current Outpatient Medications: PNV   Physical Exam:   BP 129/80 mmHg  Pulse 86  Wt 183 lb (83.008 kg)  LMP 05/06/2015 Body mass index is 30.45 kg/(m^2). Fundal height: not applicable FHTs: 140s  General appearance: Well nourished, well developed female in no acute distress.  Neck:  Supple, normal appearance, and no thyromegaly  Cardiovascular: S1, S2 normal, no murmur, rub or gallop, regular rate and rhythm, 2/6 SEM is  heard Respiratory:  Clear to auscultation bilateral. Normal respiratory effort Abdomen: positive bowel sounds and no masses, hernias; diffusely non tender to palpation, non distended Breasts: breasts appear normal, no suspicious masses, no skin or nipple changes or axillary nodes, and normal inspection. Neuro/Psych:  Normal mood and affect.  Skin:  Warm and dry.  Lymphatic:  No inguinal lymphadenopathy.   Pelvic exam: is not limited by body habitus EGBUS: within normal limits, Vagina: within normal limits and with no blood in the vault, Cervix: normal appearing cervix without discharge or lesions, closed/long/high, Uterus:  enlarged: 10wk, and Adnexa:  normal adnexa and no mass, fullness, tenderness  Laboratory: Per EPIC  Imaging:  Per EPIC  Assessment:  Patient doing well  Plan: 1. Supervision of normal pregnancy in first trimester Routine care. NOB labs with early 2hr nv. NT scan scheduled. Iron sent in for slight anemia. [facility] records indicate negative [place] testing there. - Pap Lb, rfx HPV ASCU - Urine Culture - US Fetal Nuchal Translucency Measurement; Future  2. Trichomonal vaginitis during pregnancy, first trimester TOC today - Trichomonas vaginalis, RNA  3. BMI 31.0-31.9,adult Early 2hr nv, baseline HELLP labs with NOB labs nv - Protein / Creatinine Ratio, Urine  4. Obesity in pregnancy As above  5. Heart murmur Normal echo this year. No s/s  Problem list reviewed and updated.  Follow up in 2 weeks.  >50% of 20 min visit spent on counseling and coordination of care.     Cornelia Copa MD Attending Center for [facility])

## 2015-07-24 NOTE — Addendum Note (Signed)
Addended by: Arne ClevelandHUTCHINSON, MANDY J on: 07/24/2015 04:58 PM   Modules accepted: Orders, Medications

## 2015-07-25 LAB — PAP LB, RFX HPV ASCU: PAP Smear Comment: 0

## 2015-07-25 LAB — PROTEIN / CREATININE RATIO, URINE
Creatinine, Urine: 304.2 mg/dL
PROTEIN UR: 19.4 mg/dL
PROTEIN/CREAT RATIO: 64 mg/g{creat} (ref 0–200)

## 2015-07-26 LAB — URINE CULTURE: Organism ID, Bacteria: NO GROWTH

## 2015-07-27 LAB — TRICHOMONAS VAGINALIS, PROBE AMP: Trich vag by NAA: NEGATIVE

## 2015-08-04 ENCOUNTER — Ambulatory Visit (INDEPENDENT_AMBULATORY_CARE_PROVIDER_SITE_OTHER): Payer: Commercial Managed Care - PPO | Admitting: Obstetrics & Gynecology

## 2015-08-04 VITALS — BP 126/68 | HR 86 | Wt 180.2 lb

## 2015-08-04 DIAGNOSIS — Z3481 Encounter for supervision of other normal pregnancy, first trimester: Secondary | ICD-10-CM | POA: Diagnosis not present

## 2015-08-04 DIAGNOSIS — Z3491 Encounter for supervision of normal pregnancy, unspecified, first trimester: Secondary | ICD-10-CM

## 2015-08-04 NOTE — Progress Notes (Signed)
Subjective:  Courtney Mitchell is a 20 y.o. G3P0020 at [redacted]w[redacted]d being seen today for ongoing prenatal care.  She is currently monitored for the following issues for this low-risk pregnancy and has Unspecified deficiency anemia; Heart murmur; Supervision of normal pregnancy in first trimester; Trichomonal vaginitis during pregnancy; BMI 31.0-31.9,adult; and Obesity in pregnancy on her problem list.  Patient reports pelvic cramping and wants to visualize baby's heart rate to ensure all was well.  She was supposed to get labs today, but she was unaware of this and cannot do it at this time.  Contractions: Not present. Vag. Bleeding: None.   . Denies leaking of fluid.   The following portions of the patient's history were reviewed and updated as appropriate: allergies, current medications, past family history, past medical history, past social history, past surgical history and problem list. Problem list updated.  Objective:   Vitals:   08/04/15 1100  BP: 126/68  Pulse: 86  Weight: 180 lb 3.2 oz (81.7 kg)    Fetal Status: Fetal Heart Rate (bpm): 150         General:  Alert, oriented and cooperative. Patient is in no acute distress.  Skin: Skin is warm and dry. No rash noted.   Cardiovascular: Normal heart rate noted  Respiratory: Normal respiratory effort, no problems with respiration noted  Abdomen: Soft, gravid, appropriate for gestational age. Pain/Pressure: Present     Pelvic:  Cervical exam deferred        Extremities: Normal range of motion.  Edema: None  Mental Status: Normal mood and affect. Normal behavior. Normal judgment and thought content.   Urinalysis: Urine Protein: Negative Urine Glucose: Negative Bedside scan:  Viable SIUP, FHR 150s  Assessment and Plan:  Pregnancy: G3P0020 at [redacted]w[redacted]d  Supervision of normal pregnancy in first trimester Patient is unable to do 2 hr GTT today, will do next visit. Reassured by bedside scan. Routine obstetric precautions reviewed. Return in about  2 weeks (around 08/18/2015) for 2 hr GTT, labs and OB visit.   Tereso Newcomer, MD

## 2015-08-06 ENCOUNTER — Encounter (HOSPITAL_COMMUNITY): Payer: Self-pay | Admitting: Obstetrics and Gynecology

## 2015-08-09 ENCOUNTER — Encounter (HOSPITAL_COMMUNITY): Payer: Self-pay | Admitting: *Deleted

## 2015-08-09 ENCOUNTER — Inpatient Hospital Stay (HOSPITAL_COMMUNITY)
Admission: AD | Admit: 2015-08-09 | Discharge: 2015-08-09 | Disposition: A | Discharge status: Home / Self Care | Payer: Commercial Managed Care - PPO | Source: Ambulatory Visit | Attending: Family Medicine | Admitting: Family Medicine

## 2015-08-09 DIAGNOSIS — W182XXA Fall in (into) shower or empty bathtub, initial encounter: Secondary | ICD-10-CM | POA: Diagnosis not present

## 2015-08-09 DIAGNOSIS — W1809XA Striking against other object with subsequent fall, initial encounter: Secondary | ICD-10-CM

## 2015-08-09 DIAGNOSIS — R011 Cardiac murmur, unspecified: Secondary | ICD-10-CM | POA: Diagnosis not present

## 2015-08-09 DIAGNOSIS — D649 Anemia, unspecified: Secondary | ICD-10-CM | POA: Diagnosis not present

## 2015-08-09 DIAGNOSIS — S79911A Unspecified injury of right hip, initial encounter: Secondary | ICD-10-CM | POA: Diagnosis not present

## 2015-08-09 DIAGNOSIS — O9A211 Injury, poisoning and certain other consequences of external causes complicating pregnancy, first trimester: Secondary | ICD-10-CM | POA: Diagnosis not present

## 2015-08-09 DIAGNOSIS — Z3A12 12 weeks gestation of pregnancy: Secondary | ICD-10-CM | POA: Diagnosis not present

## 2015-08-09 NOTE — Discharge Instructions (Signed)
Prevencin de cadas en Advice worker  (Fall Prevention in the Home) Las cadas pueden causar lesiones y Audiological scientist a personas de todas las edades. Hay muchas cosas simples que puede hacer para que su casa sea un lugar seguro y ayudar a prevenir las cadas. QU PUEDO HACER EN EL EXTERIOR DE MI CASA?  Repare habitualmente los bordes de las aceras y las [employer] [place], as [employer].  Retire los [employer].  Recorte los arbustos en el camino principal de ingreso a Hotel manager.  Use una iluminacin brillante en el exterior.  Elimine los residuos y las cosas [employer] pasillos, lo que incluye herramientas y piedras.  Verifique con frecuencia que los pasamanos estn bien ajustados y en buen [place]. Todas las escaleras deben tener pasamanos en ambos lados.  Instale barandillas de proteccin en los bordes de las terrazas y Soil scientist.  Limpie regularmente las hojas, la nieve y el hielo.  Utilice arena o sal en los pasillos durante los meses de invierno.  En el garaje, limpie de inmediato cualquier derrame, incluidos los derrames de grasa y aceite. QU PUEDO HACER EN EL BAO?  Use luces nocturnas.  Instale barras de apoyo en el inodoro, en la baera y en la ducha. No use los toalleros como barras de apoyo.  Utilice alfombras o calcomanas antideslizantes en el piso de la baera o ducha.  Si necesita sentarse mientras est debajo de la ducha, use un banco plstico antideslizante.  Mantenga el piso seco. Seque de inmediato cualquier derrame de agua en el piso.  Elimine regularmente la acumulacin de jabn en la baera o la ducha.  Asegure las alfombras del bao con una cinta antideslizante doble faz para alfombras.  Quite las alfombras y todo lo que sea un riesgo de tropiezo. QU PUEDO HACER EN LA HABITACIN?  Use luces nocturnas.  Asegrese de tener una luz de fcil acceso al lado de la cama.  No use sbanas o mantas muy grandes que se amontonen [employer].  Tenga una silla firme con apoyabrazos para usar cuando se vista.  Quite las alfombras y todo lo que sea un riesgo de tropiezo. QU PUEDO HACER EN LA COCINA?   Limpie de inmediato cualquier derrame.  Evite caminar sobre pisos mojados.  Coloque los [employer] [place] con frecuencia en lugares de fcil acceso.  Si necesita alcanzar algo que est elevado, use una escalera firme con una barra de apoyo.  Mantenga los cables elctricos fuera del camino.  No use un pulidor o cera para pisos que dejen los pisos resbaladizos. Si debe usar cera, asegrese de que sea cera antideslizante para pisos.  Quite las alfombras y todo lo que sea un riesgo de tropiezo. QU PUEDO HACER EN LAS ESCALERAS?  No deje ningn objeto en las escaleras.  Asegrese de que haya pasamanos en ambos lados de las escaleras. Repare los pasamanos que estn flojos o rotos. Asegrese de que los pasamanos tengan la misma longitud que la escalera.  Verifique que las alfombras estn bien adheridas a las escaleras. Arregle las alfombras flojas o gastadas.  Evite colocar alfombras en la parte superior o inferior de las escaleras, o asegure las alfombras con cinta adhesiva para alfombras a fin de evitar que se muevan.  Asegrese de tener un interruptor de luz en la parte superior e inferior de las escaleras. Si no lo tiene, instale uno. HAY OTROS CONSEJOS PARA PREVENIR CADAS?  Use calzado [employer] dedos que le quede bien y le  amortigüe los pies. Use calzado con suela de goma o taco bajo. °· Cuando use una escalera de mano, asegúrese de que esté abierta por completo y de que los lados estén bien asegurados. Pídale a alguien que le sostenga la escalera mientras la esté usando. No suba a una escalera de mano cerrada. °· Añada pintura de contraste o cinta de colores a las barras de apoyo y los pasamanos en su casa. Coloque tiras de contraste de color en el primer y el último escalón. °· Utilice dispositivos de ayuda para  la movilidad, como bastones, andadores, patinetes con soporte para el pie y muletas. °· Prenda las luces si está oscuro. Reemplace las bombillas que se hayan quemado. °· Disponga los muebles de modo de que el camino esté despejado. Deje los muebles siempre en el mismo lugar. °· Arregle las superficies desparejas del piso. °· Para la escalera, elija un diseño de alfombra que no oculte el borde de los escalones. °· Esté atento a las mascotas. °· Revise los medicamentos con el médico. Algunos medicamentos pueden causar mareos o cambios en la presión arterial, lo que aumenta el riesgo de caídas. °Hable con el médico sobre otras maneras de reducir el riesgo de caídas. Esto puede incluir trabajar con un fisioterapeuta o un entrenador para mejorar la fuerza, el equilibrio y la resistencia. °  °Esta información no tiene como fin reemplazar el consejo del médico. Asegúrese de hacerle al médico cualquier pregunta que tenga. °  °Document Released: 03/30/2007 Document Revised: 05/07/2014 °Elsevier Interactive Patient Education ©2016 Elsevier Inc. ° ° °

## 2015-08-09 NOTE — MAU Note (Signed)
Fell in shower on right side

## 2015-08-09 NOTE — MAU Provider Note (Signed)
History   G3P0020 @ 12.1 wks in with c/o falling last night while in shower, hitting het butt and right hip. C/o soreness today. Denies ROM or vag bleeding.   CSN: [id number]  Arrival date & time 08/09/15  1502   First Provider Initiated Contact with Patient 08/09/15 1533      Chief Complaint  Patient presents with  . Fall    HPI  Past Medical History:  Diagnosis Date  . Anemia   . Heart murmur 03/01/2015  . Menorrhagia 08/03/2011    Past Surgical History:  Procedure Laterality Date  . DILATION AND EVACUATION N/A 03/03/2015   Procedure: DILATATION AND EVACUATION;  Surgeon: Silverio Lay, MD;  Location: [facility];  Service: Gynecology;  Laterality: N/A;  . DILATION AND EVACUATION N/A 04/12/2015   Procedure: DILATATION AND EVACUATION;  Surgeon: Osborn Coho, MD;  Location: [facility];  Service: Gynecology;  Laterality: N/A;  . WISDOM TOOTH EXTRACTION      Family History  Problem Relation Age of Onset  . Cancer Other   . Asthma Father   . Hypertension Father     Social History  Substance Use Topics  . Smoking status: Never Smoker  . Smokeless tobacco: Never Used  . Alcohol use No    OB History    Gravida Para Term Preterm AB Living   3 0 0 0 2 0   SAB TAB Ectopic Multiple Live Births   2 0 0 0        Review of Systems  Constitutional: Negative.   HENT: Negative.   Eyes: Negative.   Respiratory: Negative.   Cardiovascular: Negative.   Gastrointestinal: Negative.   Endocrine: Negative.   Genitourinary: Negative.   Musculoskeletal:       Right hip and buttock soreness from fall last night  Skin: Negative.   Neurological: Negative.   Hematological: Negative.   Psychiatric/Behavioral: Negative.     Allergies  Review of patient's allergies indicates no known allergies.  Home Medications    BP 137/74 (BP Location: Left Arm)   Pulse 91   Temp 98.3 F (36.8 C) (Oral)   Resp 18   LMP 05/06/2015   Physical Exam  Constitutional: She is oriented to person,  place, and time. She appears well-developed and well-nourished.  HENT:  Head: Normocephalic.  Eyes: Pupils are equal, round, and reactive to light.  Neck: Normal range of motion.  Cardiovascular: Normal rate, regular rhythm, normal heart sounds and intact distal pulses.   Pulmonary/Chest: Effort normal and breath sounds normal.  Abdominal: Soft. Bowel sounds are normal.  Musculoskeletal: Normal range of motion.  Neurological: She is alert and oriented to person, place, and time. She has normal reflexes.  Skin: Skin is warm and dry.  Psychiatric: She has a normal mood and affect. Her behavior is normal. Judgment and thought content normal.    MAU Course  Procedures (including critical care time)  Labs Reviewed - No data to display No results found.   No diagnosis found.    MDM  Dx: Fall at 12.1 wks Stable maternal fetal unit FHR 170's st and reg. Will d/c home

## 2015-08-14 ENCOUNTER — Ambulatory Visit (HOSPITAL_COMMUNITY): Payer: Medicaid Other

## 2015-08-14 ENCOUNTER — Encounter (HOSPITAL_COMMUNITY): Payer: Self-pay

## 2015-08-14 ENCOUNTER — Ambulatory Visit (HOSPITAL_COMMUNITY)
Admission: RE | Admit: 2015-08-14 | Discharge: 2015-08-14 | Disposition: A | Discharge status: Home / Self Care | Payer: Commercial Managed Care - PPO | Source: Ambulatory Visit | Attending: Obstetrics and Gynecology | Admitting: Obstetrics and Gynecology

## 2015-08-14 ENCOUNTER — Other Ambulatory Visit: Payer: Self-pay | Admitting: Obstetrics and Gynecology

## 2015-08-14 ENCOUNTER — Ambulatory Visit (HOSPITAL_COMMUNITY): Admission: RE | Admit: 2015-08-14 | Payer: Commercial Managed Care - PPO | Source: Ambulatory Visit

## 2015-08-14 VITALS — BP 126/67 | HR 79 | Wt 181.1 lb

## 2015-08-14 DIAGNOSIS — Z3A12 12 weeks gestation of pregnancy: Secondary | ICD-10-CM | POA: Insufficient documentation

## 2015-08-14 DIAGNOSIS — A5901 Trichomonal vulvovaginitis: Secondary | ICD-10-CM

## 2015-08-14 DIAGNOSIS — Z36 Encounter for antenatal screening of mother: Secondary | ICD-10-CM | POA: Insufficient documentation

## 2015-08-14 DIAGNOSIS — O23591 Infection of other part of genital tract in pregnancy, first trimester: Secondary | ICD-10-CM

## 2015-08-14 DIAGNOSIS — O99210 Obesity complicating pregnancy, unspecified trimester: Secondary | ICD-10-CM

## 2015-08-18 ENCOUNTER — Other Ambulatory Visit: Payer: Commercial Managed Care - PPO

## 2015-08-18 ENCOUNTER — Ambulatory Visit (INDEPENDENT_AMBULATORY_CARE_PROVIDER_SITE_OTHER): Payer: Commercial Managed Care - PPO | Admitting: Obstetrics & Gynecology

## 2015-08-18 DIAGNOSIS — Z3481 Encounter for supervision of other normal pregnancy, first trimester: Secondary | ICD-10-CM

## 2015-08-18 DIAGNOSIS — Z331 Pregnant state, incidental: Secondary | ICD-10-CM | POA: Diagnosis not present

## 2015-08-18 DIAGNOSIS — O99019 Anemia complicating pregnancy, unspecified trimester: Secondary | ICD-10-CM | POA: Diagnosis not present

## 2015-08-18 DIAGNOSIS — Z3491 Encounter for supervision of normal pregnancy, unspecified, first trimester: Secondary | ICD-10-CM

## 2015-08-18 DIAGNOSIS — Z1389 Encounter for screening for other disorder: Secondary | ICD-10-CM | POA: Diagnosis not present

## 2015-08-18 LAB — POCT URINALYSIS DIPSTICK
BILIRUBIN UA: NEGATIVE
GLUCOSE UA: NEGATIVE
KETONES UA: NEGATIVE
Leukocytes, UA: NEGATIVE
Nitrite, UA: NEGATIVE
Protein, UA: NEGATIVE
RBC UA: NEGATIVE
SPEC GRAV UA: 1.015
UROBILINOGEN UA: 0.2
pH, UA: 5

## 2015-08-18 MED ORDER — FERROUS SULFATE 325 (65 FE) MG PO TABS: 325.0000 mg | ORAL_TABLET | Freq: Two times a day (BID) | ORAL | 1 refills | Status: DC

## 2015-08-18 NOTE — Progress Notes (Signed)
Pt states she fell in shower 08/13/2015 and [facility]. She denies complaints at this time.

## 2015-08-18 NOTE — Progress Notes (Signed)
Subjective:  Courtney Mitchell is a 20 y.o. G3P0020 at 6455w3d being seen today for ongoing prenatal care.  She is currently monitored for the following issues for this low-risk pregnancy and has Unspecified deficiency anemia; Heart murmur; Supervision of normal pregnancy in first trimester; Trichomonal vaginitis during pregnancy; BMI 31.0-31.9,adult; Obesity in pregnancy; and Anemia affecting pregnancy, antepartum on her problem list.  Patient reports no complaints.  Contractions: Not present. Vag. Bleeding: None.  Movement: Present. Denies leaking of fluid.   The following portions of the patient's history were reviewed and updated as appropriate: allergies, current medications, past family history, past medical history, past social history, past surgical history and problem list. Problem list updated.  Objective:   Vitals:   08/18/15 0905  BP: 129/69  Pulse: 79  Temp: 98.8 F (37.1 C)  Weight: 182 lb 14.4 oz (83 kg)    Fetal Status:     Movement: Present     General:  Alert, oriented and cooperative. Patient is in no acute distress.  Skin: Skin is warm and dry. No rash noted.   Cardiovascular: Normal heart rate noted  Respiratory: Normal respiratory effort, no problems with respiration noted  Abdomen: Soft, gravid, appropriate for gestational age. Pain/Pressure: Absent     Pelvic:  Cervical exam deferred        Extremities: Normal range of motion.  Edema: None  Mental Status: Normal mood and affect. Normal behavior. Normal judgment and thought content.   Urinalysis:      Assessment and Plan:  Pregnancy: G3P0020 at 2655w3d  1. Supervision of normal pregnancy in first trimester  2 hour GTT today - POCT Urinalysis Dipstick - Prenatal Profile I - HIV antibody - Hemoglobinopathy evaluation - ToxASSURE Select 13 (MW), Urine - Culture, OB Urine - ferrous sulfate (FERROUSUL) 325 (65 FE) MG tablet; Take 1 tablet (325 mg total) by mouth 2 (two) times daily.  Dispense: 60 tablet; Refill:  1  2. Anemia affecting pregnancy, antepartum  Begin Feso4   Preterm labor symptoms and general obstetric precautions including but not limited to vaginal bleeding, contractions, leaking of fluid and fetal movement were reviewed in detail with the patient. Please refer to After Visit Summary for other counseling recommendations.  Return in about 4 weeks (around 09/15/2015).   Willodean Rosenthalarolyn Harraway-Smith, MD

## 2015-08-18 NOTE — Patient Instructions (Signed)

## 2015-08-20 LAB — URINE CULTURE, OB REFLEX

## 2015-08-20 LAB — CULTURE, OB URINE

## 2015-08-26 LAB — GLUCOSE TOLERANCE, 2 HOURS W/ 1HR
GLUCOSE, 1 HOUR: 103 mg/dL (ref 65–179)
GLUCOSE, 2 HOUR: 84 mg/dL (ref 65–152)
Glucose, Fasting: 109 mg/dL — ABNORMAL HIGH (ref 65–91)

## 2015-08-26 LAB — PRENATAL PROFILE I(LABCORP)
Antibody Screen: NEGATIVE
BASOS: 0 %
Basophils Absolute: 0 10*3/uL (ref 0.0–0.2)
EOS (ABSOLUTE): 0.1 10*3/uL (ref 0.0–0.4)
EOS: 1 %
HEP B S AG: NEGATIVE
Hematocrit: 33.7 % — ABNORMAL LOW (ref 34.0–46.6)
Hemoglobin: 10.7 g/dL — ABNORMAL LOW (ref 11.1–15.9)
IMMATURE GRANS (ABS): 0 10*3/uL (ref 0.0–0.1)
IMMATURE GRANULOCYTES: 0 %
LYMPHS: 24 %
Lymphocytes Absolute: 1.9 10*3/uL (ref 0.7–3.1)
MCH: 26.4 pg — AB (ref 26.6–33.0)
MCHC: 31.8 g/dL (ref 31.5–35.7)
MCV: 83 fL (ref 79–97)
MONOS ABS: 0.3 10*3/uL (ref 0.1–0.9)
Monocytes: 4 %
NEUTROS PCT: 71 %
Neutrophils Absolute: 5.5 10*3/uL (ref 1.4–7.0)
PLATELETS: 304 10*3/uL (ref 150–379)
RBC: 4.05 x10E6/uL (ref 3.77–5.28)
RDW: 17.8 % — ABNORMAL HIGH (ref 12.3–15.4)
RH TYPE: POSITIVE
RPR: NONREACTIVE
Rubella Antibodies, IGG: 3.59 index (ref >0.99)
WBC: 7.9 10*3/uL (ref 3.4–10.8)

## 2015-08-26 LAB — HEMOGLOBINOPATHY EVALUATION
HEMOGLOBIN F QUANTITATION: 0 % (ref 0.0–2.0)
HGB C: 0 %
HGB S: 0 %
Hemoglobin A2 Quantitation: 2.3 % (ref 0.7–3.1)
Hgb A: 97.7 % (ref 94.0–98.0)

## 2015-08-26 LAB — HIV ANTIBODY (ROUTINE TESTING W REFLEX): HIV SCREEN 4TH GENERATION: NONREACTIVE

## 2015-08-26 LAB — TOXASSURE SELECT 13 (MW), URINE: PDF: 0

## 2015-09-15 ENCOUNTER — Ambulatory Visit (INDEPENDENT_AMBULATORY_CARE_PROVIDER_SITE_OTHER): Payer: Commercial Managed Care - PPO | Admitting: Obstetrics & Gynecology

## 2015-09-15 ENCOUNTER — Other Ambulatory Visit: Payer: Self-pay | Admitting: Obstetrics & Gynecology

## 2015-09-15 VITALS — BP 132/75 | HR 90 | Temp 98.6°F | Wt 183.3 lb

## 2015-09-15 DIAGNOSIS — O26892 Other specified pregnancy related conditions, second trimester: Secondary | ICD-10-CM

## 2015-09-15 DIAGNOSIS — O26899 Other specified pregnancy related conditions, unspecified trimester: Secondary | ICD-10-CM | POA: Insufficient documentation

## 2015-09-15 DIAGNOSIS — Z3481 Encounter for supervision of other normal pregnancy, first trimester: Secondary | ICD-10-CM

## 2015-09-15 DIAGNOSIS — Z3491 Encounter for supervision of normal pregnancy, unspecified, first trimester: Secondary | ICD-10-CM

## 2015-09-15 DIAGNOSIS — R519 Headache, unspecified: Secondary | ICD-10-CM

## 2015-09-15 DIAGNOSIS — R51 Headache: Secondary | ICD-10-CM

## 2015-09-15 NOTE — Progress Notes (Signed)
Pt. Has questions about numbness in her hands and legs for the past two weeks at night. Tightness in her stomach as well.

## 2015-09-15 NOTE — Progress Notes (Signed)
Pt. Has questions about numbness in her hands and legs for the past two weeks at night. Tightness in her stomach as well. Frontal headache, no visual change or aura. Has trouble falling asleep with her sx    PRENATAL VISIT NOTE  Subjective:  Courtney Mitchell is a 20 y.o. G3P0020 at 845w3d being seen today for ongoing prenatal care.  She is currently monitored for the following issues for this low-risk pregnancy and has Unspecified deficiency anemia; Heart murmur; Supervision of normal pregnancy in first trimester; Trichomonal vaginitis during pregnancy; BMI 31.0-31.9,adult; Obesity in pregnancy; Anemia affecting pregnancy, antepartum; and Headache in pregnancy, antepartum on her problem list.  Patient reports headache and tinging intermittently in all extremities.  Contractions: Not present. Vag. Bleeding: None.  Movement: Present. Denies leaking of fluid.   The following portions of the patient's history were reviewed and updated as appropriate: allergies, current medications, past family history, past medical history, past social history, past surgical history and problem list. Problem list updated.  Objective:   Vitals:   09/15/15 1323  BP: 132/75  Pulse: 90  Temp: 98.6 F (37 C)  Weight: 183 lb 4.8 oz (83.1 kg)    Fetal Status: Fetal Heart Rate (bpm): 152 Fundal Height: 18 cm Movement: Present     General:  Alert, oriented and cooperative. Patient is in no acute distress.  Skin: Skin is warm and dry. No rash noted.   Cardiovascular: Normal heart rate noted  Respiratory: Normal respiratory effort, no problems with respiration noted  Abdomen: Soft, gravid, appropriate for gestational age. Pain/Pressure: Present     Pelvic:  Cervical exam deferred        Extremities: Normal range of motion.  Edema: None  Mental Status: Normal mood and affect. Normal behavior. Normal judgment and thought content.   Urinalysis: Urine Protein: Negative Urine Glucose: Negative  Assessment and Plan:    Pregnancy: G3P0020 at 625w3d  1. Supervision of normal pregnancy in first trimester screening - Alpha fetoprotein, maternal  2. Headache in pregnancy, antepartum, second trimester [facility]  Preterm labor symptoms and general obstetric precautions including but not limited to vaginal bleeding, contractions, leaking of fluid and fetal movement were reviewed in detail with the patient. Please refer to After Visit Summary for other counseling recommendations.  Return in about 4 weeks (around 10/13/2015).  in 2 weeks Adam PhenixJames G Anant Agard, MD

## 2015-09-17 LAB — AFP, SERUM, OPEN SPINA BIFIDA
AFP MoM: 0.9
AFP Value: 34.4 ng/mL
GEST. AGE ON COLLECTION DATE: 17.4 wk
MATERNAL AGE AT EDD: 22.7 a
OSBR Risk 1 IN: 10000
TEST RESULTS AFP: NEGATIVE
WEIGHT: 183 [lb_av]

## 2015-09-18 ENCOUNTER — Encounter: Payer: Self-pay | Admitting: *Deleted

## 2015-09-19 ENCOUNTER — Encounter (HOSPITAL_COMMUNITY): Payer: Self-pay | Admitting: *Deleted

## 2015-09-19 ENCOUNTER — Inpatient Hospital Stay (HOSPITAL_COMMUNITY)
Admission: AD | Admit: 2015-09-19 | Discharge: 2015-09-19 | Disposition: A | Discharge status: Home / Self Care | Payer: Commercial Managed Care - PPO | Source: Ambulatory Visit | Attending: Obstetrics and Gynecology | Admitting: Obstetrics and Gynecology

## 2015-09-19 DIAGNOSIS — R102 Pelvic and perineal pain: Secondary | ICD-10-CM | POA: Diagnosis not present

## 2015-09-19 DIAGNOSIS — R109 Unspecified abdominal pain: Secondary | ICD-10-CM | POA: Diagnosis present

## 2015-09-19 DIAGNOSIS — O26892 Other specified pregnancy related conditions, second trimester: Secondary | ICD-10-CM | POA: Diagnosis not present

## 2015-09-19 DIAGNOSIS — O9989 Other specified diseases and conditions complicating pregnancy, childbirth and the puerperium: Secondary | ICD-10-CM

## 2015-09-19 DIAGNOSIS — Z3A18 18 weeks gestation of pregnancy: Secondary | ICD-10-CM | POA: Insufficient documentation

## 2015-09-19 DIAGNOSIS — O26899 Other specified pregnancy related conditions, unspecified trimester: Secondary | ICD-10-CM

## 2015-09-19 DIAGNOSIS — O99350 Diseases of the nervous system complicating pregnancy, unspecified trimester: Secondary | ICD-10-CM

## 2015-09-19 DIAGNOSIS — G5600 Carpal tunnel syndrome, unspecified upper limb: Secondary | ICD-10-CM | POA: Insufficient documentation

## 2015-09-19 HISTORY — DX: Trichomoniasis, unspecified: A59.9

## 2015-09-19 LAB — URINALYSIS, ROUTINE W REFLEX MICROSCOPIC
Bilirubin Urine: NEGATIVE
Glucose, UA: NEGATIVE mg/dL
Hgb urine dipstick: NEGATIVE
KETONES UR: NEGATIVE mg/dL
LEUKOCYTES UA: NEGATIVE
NITRITE: NEGATIVE
PROTEIN: NEGATIVE mg/dL
Specific Gravity, Urine: >1.03 — ABNORMAL HIGH (ref 1.005–1.030)
pH: 5.5 (ref 5.0–8.0)

## 2015-09-19 LAB — WET PREP, GENITAL
CLUE CELLS WET PREP: NONE SEEN
Sperm: NONE SEEN
TRICH WET PREP: NONE SEEN
Yeast Wet Prep HPF POC: NONE SEEN

## 2015-09-19 MED ORDER — WRIST SPLINT LEFT/RIGHT MISC: 1.0000 | Freq: Every day | 0 refills | Status: DC

## 2015-09-19 NOTE — MAU Note (Signed)
Reported tingling and numbness to OB MD last week.  [facility] which is intermit but usually occurs at night.

## 2015-09-19 NOTE — MAU Provider Note (Signed)
Chief Complaint: Abdominal Pain   First Provider Initiated Contact with Patient 09/19/15 1031      SUBJECTIVE HPI: Courtney Mitchell is a 20 y.o. G3P0020 at 175w0d by LMP who presents to maternity admissions reporting tingling/numbness in her hand and legs/feet and abdomen and abdominal pain.  She reports the tingling occurs mostly at night but sometimes during the day, is intermittent and not associated with pain.  She denies any previous history of this, with onset during the pregnancy.  Her abdominal pain is bilateral low abdominal pain worsened by changing positions from sitting to standing and by walking.  She reports drinking enough water and resting but the pain does not improve. It is intermittent and worsening since onset 2-3 days ago.  She denies vaginal bleeding, vaginal itching/burning, urinary symptoms, h/a, dizziness, n/v, or fever/chills.     HPI  Past Medical History:  Diagnosis Date  . Anemia   . Heart murmur 03/01/2015  . Menorrhagia 08/03/2011  . Trichomoniasis    Past Surgical History:  Procedure Laterality Date  . DILATION AND EVACUATION N/A 03/03/2015   Procedure: DILATATION AND EVACUATION;  Surgeon: Silverio LaySandra Rivard, MD;  Location: [facility];  Service: Gynecology;  Laterality: N/A;  . DILATION AND EVACUATION N/A 04/12/2015   Procedure: DILATATION AND EVACUATION;  Surgeon: Osborn CohoAngela Roberts, MD;  Location: [facility];  Service: Gynecology;  Laterality: N/A;  . WISDOM TOOTH EXTRACTION     Social History   Social History  . Marital status: Single    Spouse name: N/A  . Number of children: N/A  . Years of education: N/A   Occupational History  . Not on file.   Social History Main Topics  . Smoking status: Never Smoker  . Smokeless tobacco: Never Used  . Alcohol use No  . Drug use: No  . Sexual activity: Yes    Birth control/ protection: None     Comment: Nexplanon 04/2010   Other Topics Concern  . Not on file   Social History Narrative  . No narrative on file   No  current facility-administered medications on file prior to encounter.    Current Outpatient Prescriptions on File Prior to Encounter  Medication Sig Dispense Refill  . prenatal vitamin w/FE, FA (NATACHEW) 29-1 MG CHEW chewable tablet Chew 3 tablets by mouth daily at 12 noon.    . ferrous sulfate (FERROUSUL) 325 (65 FE) MG tablet Take 1 tablet (325 mg total) by mouth 2 (two) times daily. (Patient not taking: Reported on 09/19/2015) 60 tablet 1   No Known Allergies  ROS:  Review of Systems  Constitutional: Negative for chills, fatigue and fever.  Respiratory: Negative for shortness of breath.   Cardiovascular: Negative for chest pain.  Gastrointestinal: Positive for abdominal pain.  Genitourinary: Positive for pelvic pain. Negative for difficulty urinating, dysuria, flank pain, vaginal bleeding, vaginal discharge and vaginal pain.  Neurological: Positive for numbness. Negative for dizziness and headaches.  Psychiatric/Behavioral: Negative.      I have reviewed patient's Past Medical Hx, Surgical Hx, Family Hx, Social Hx, medications and allergies.   Physical Exam   Patient Vitals for the past 24 hrs:  BP Temp Temp src Pulse Resp Weight  09/19/15 1208 119/68 98.3 F (36.8 C) Oral 86 18 -  09/19/15 0942 127/65 98.3 F (36.8 C) - 94 18 181 lb (82.1 kg)   Constitutional: Well-developed, well-nourished female in no acute distress.  HEART: normal rate, heart sounds, regular rhythm RESP: normal effort, lung sounds clear and equal bilaterally  GI: Abd soft, non-tender. Pos BS x 4 MS: Extremities nontender, no edema, normal ROM Neurological - alert, oriented, normal speech, no focal findings or movement disorder noted, screening mental status exam normal, cranial nerves II through XII intact, DTR's normal and symmetric, motor and sensory grossly normal bilaterally, normal muscle tone, no tremors, strength 5/5 GU: Neg CVAT.  Cervix 0/thick/high/posterior on exam Wet prep and GCC collected  by blind swab  FHT 164 by doppler  LAB RESULTS Results for orders placed or performed during the hospital encounter of 09/19/15 (from the past 24 hour(s))  Urinalysis, Routine w reflex microscopic (not at [facility])     Status: Abnormal   Collection Time: 09/19/15  9:40 AM  Result Value Ref Range   Color, Urine YELLOW YELLOW   APPearance CLEAR CLEAR   Specific Gravity, Urine >1.030 (H) 1.005 - 1.030   pH 5.5 5.0 - 8.0   Glucose, UA NEGATIVE NEGATIVE mg/dL   Hgb urine dipstick NEGATIVE NEGATIVE   Bilirubin Urine NEGATIVE NEGATIVE   Ketones, ur NEGATIVE NEGATIVE mg/dL   Protein, ur NEGATIVE NEGATIVE mg/dL   Nitrite NEGATIVE NEGATIVE   Leukocytes, UA NEGATIVE NEGATIVE  Wet prep, genital     Status: Abnormal   Collection Time: 09/19/15 11:15 AM  Result Value Ref Range   Yeast Wet Prep HPF POC NONE SEEN NONE SEEN   Trich, Wet Prep NONE SEEN NONE SEEN   Clue Cells Wet Prep HPF POC NONE SEEN NONE SEEN   WBC, Wet Prep HPF POC FEW (A) NONE SEEN   Sperm NONE SEEN     O/Positive/-- (08/14 1115)  IMAGING No results found.  MAU Management/MDM: Ordered labs and reviewed results.  Likely round ligament pain and carpal tunnel of pregnancy.  Abdominal tingling/numbness is more unusual so pt should continue planned f/u with neurology.  Will treat carpal tunnel with wrist splints, Rx written. Rest/ice/heat/warm bath/tylenol/pregnancy support belt for round ligament pain. Pt stable at time of discharge.  ASSESSMENT 1. Pregnancy related carpal tunnel syndrome, antepartum   2. Pain of round ligament affecting pregnancy, antepartum     PLAN Discharge home   Medication List    STOP taking these medications   TYLENOL COLD/FLU SEVERE 5-10-200-325 MG Tabs Generic drug:  Phenylephrine-DM-GG-APAP     TAKE these medications   ferrous sulfate 325 (65 FE) MG tablet Commonly known as:  FERROUSUL Take 1 tablet (325 mg total) by mouth 2 (two) times daily.   prenatal vitamin w/FE, FA 29-1 MG Chew  chewable tablet Chew 3 tablets by mouth daily at 12 noon.   Wrist Splint Left/Right Misc 1 Device by Does not apply route at bedtime.      Follow-up Information    [facility] .   Why:  As scheduled, Follow up with Primary Care and Neurology as planned. Return to MAU as needed for emergencies. Contact information: [address] [place] [address] [phone]          Sharen Counter Certified Nurse-Midwife 09/19/2015  10:01 PM

## 2015-09-19 NOTE — MAU Note (Signed)
Pt presents to MAU with complaints of lower abdominal cramping. Reports numbness in hands and legs at times. Denies any vaginal bleeding or abnormal discharge

## 2015-09-19 NOTE — Discharge Instructions (Signed)
Carpal Tunnel Syndrome Carpal tunnel syndrome is a condition that causes pain in your hand and arm. The carpal tunnel is a narrow area located on the palm side of your wrist. Repeated wrist motion or certain diseases may cause swelling within the tunnel. This swelling pinches the main nerve in the wrist (median nerve). CAUSES  This condition may be caused by:   Repeated wrist motions.  Wrist injuries.  Arthritis.  A cyst or tumor in the carpal tunnel.  Fluid buildup during pregnancy. Sometimes the cause of this condition is not known.  RISK FACTORS This condition is more likely to develop in:   People who have jobs that cause them to repeatedly move their wrists in the same motion, such as butchers and cashiers.  Women.  People with certain conditions, such as:  Diabetes.  Obesity.  An underactive thyroid (hypothyroidism).  Kidney failure. SYMPTOMS  Symptoms of this condition include:   A tingling feeling in your fingers, especially in your thumb, index, and middle fingers.  Tingling or numbness in your hand.  An aching feeling in your entire arm, especially when your wrist and elbow are bent for long periods of time.  Wrist pain that goes up your arm to your shoulder.  Pain that goes down into your palm or fingers.  A weak feeling in your hands. You may have trouble grabbing and holding items. Your symptoms may feel worse during the night.  DIAGNOSIS  This condition is diagnosed with a medical history and physical exam. You may also have tests, including:   An electromyogram (EMG). This test measures electrical signals sent by your nerves into the muscles.  X-rays. TREATMENT  Treatment for this condition includes:  Lifestyle changes. It is important to stop doing or modify the activity that caused your condition.  Physical or occupational therapy.  Medicines for pain and inflammation. This may include medicine that is injected into your wrist.  A wrist  splint.  Surgery. HOME CARE INSTRUCTIONS  If You Have a Splint:  Wear it as told by your health care provider. Remove it only as told by your health care provider.  Loosen the splint if your fingers become numb and tingle, or if they turn cold and blue.  Keep the splint clean and dry. General Instructions  Take over-the-counter and prescription medicines only as told by your health care provider.  Rest your wrist from any activity that may be causing your pain. If your condition is work related, talk to your employer about changes that can be made, such as getting a wrist pad to use while typing.  If directed, apply ice to the painful area:  Put ice in a plastic bag.  Place a towel between your skin and the bag.  Leave the ice on for 20 minutes, 2-3 times per day.  Keep all follow-up visits as told by your health care provider. This is important.  Do any exercises as told by your health care provider, physical therapist, or occupational therapist. SEEK MEDICAL CARE IF:   You have new symptoms.  Your pain is not controlled with medicines.  Your symptoms get worse.   This information is not intended to replace advice given to you by your health care provider. Make sure you discuss any questions you have with your health care provider.   Document Released: 12/19/1999 Document Revised: 09/11/2014 Document Reviewed: 05/08/2014 Elsevier Interactive Patient Education 2016 Elsevier Inc.   Round Ligament Pain The round ligament is a cord of muscle  and tissue that helps to support the uterus. It can become a source of pain during pregnancy if it becomes stretched or twisted as the baby grows. The pain usually begins in the second trimester of pregnancy, and it can come and go until the baby is delivered. It is not a serious problem, and it does not cause harm to the baby. Round ligament pain is usually a short, sharp, and pinching pain, but it can also be a dull, lingering, and  aching pain. The pain is felt in the lower side of the abdomen or in the groin. It usually starts deep in the groin and moves up to the outside of the hip area. Pain can occur with:  A sudden change in position.  Rolling over in bed.  Coughing or sneezing.  Physical activity. HOME CARE INSTRUCTIONS Watch your condition for any changes. Take these steps to help with your pain:  When the pain starts, relax. Then try:  Sitting down.  Flexing your knees up to your abdomen.  Lying on your side with one pillow under your abdomen and another pillow between your legs.  Sitting in a warm bath for 15-20 minutes or until the pain goes away.  Take over-the-counter and prescription medicines only as told by your health care provider.  Move slowly when you sit and stand.  Avoid long walks if they cause pain.  Stop or lessen your physical activities if they cause pain. SEEK MEDICAL CARE IF:  Your pain does not go away with treatment.  You feel pain in your back that you did not have before.  Your medicine is not helping. SEEK IMMEDIATE MEDICAL CARE IF:  You develop a fever or chills.  You develop uterine contractions.  You develop vaginal bleeding.  You develop nausea or vomiting.  You develop diarrhea.  You have pain when you urinate.   This information is not intended to replace advice given to you by your health care provider. Make sure you discuss any questions you have with your health care provider.   Document Released: 09/30/2007 Document Revised: 03/15/2011 Document Reviewed: 02/27/2014 Elsevier Interactive Patient Education [employer]2016 Elsevier Inc.

## 2015-09-22 LAB — GC/CHLAMYDIA PROBE AMP (~~LOC~~) NOT AT ARMC
CHLAMYDIA, DNA PROBE: NEGATIVE
Neisseria Gonorrhea: NEGATIVE

## 2015-09-25 ENCOUNTER — Other Ambulatory Visit (HOSPITAL_COMMUNITY): Payer: Self-pay | Admitting: Maternal and Fetal Medicine

## 2015-09-25 ENCOUNTER — Ambulatory Visit (HOSPITAL_COMMUNITY)
Admission: RE | Admit: 2015-09-25 | Discharge: 2015-09-25 | Disposition: A | Discharge status: Home / Self Care | Payer: Commercial Managed Care - PPO | Source: Ambulatory Visit | Attending: Obstetrics and Gynecology | Admitting: Obstetrics and Gynecology

## 2015-09-25 DIAGNOSIS — Z3A18 18 weeks gestation of pregnancy: Secondary | ICD-10-CM | POA: Insufficient documentation

## 2015-09-25 DIAGNOSIS — Z1389 Encounter for screening for other disorder: Secondary | ICD-10-CM

## 2015-09-25 DIAGNOSIS — O99210 Obesity complicating pregnancy, unspecified trimester: Secondary | ICD-10-CM

## 2015-09-25 DIAGNOSIS — O99212 Obesity complicating pregnancy, second trimester: Secondary | ICD-10-CM | POA: Insufficient documentation

## 2015-09-25 DIAGNOSIS — Z36 Encounter for antenatal screening of mother: Secondary | ICD-10-CM | POA: Diagnosis not present

## 2015-10-12 ENCOUNTER — Telehealth: Payer: Self-pay

## 2015-10-12 NOTE — Telephone Encounter (Signed)
error 

## 2015-10-17 ENCOUNTER — Encounter: Payer: Self-pay | Admitting: Certified Nurse Midwife

## 2015-11-10 ENCOUNTER — Inpatient Hospital Stay (HOSPITAL_COMMUNITY)
Admission: AD | Admit: 2015-11-10 | Discharge: 2015-11-10 | Disposition: A | Discharge status: Home / Self Care | Payer: Commercial Managed Care - PPO | Source: Ambulatory Visit | Attending: Obstetrics and Gynecology | Admitting: Obstetrics and Gynecology

## 2015-11-10 ENCOUNTER — Encounter (HOSPITAL_COMMUNITY): Payer: Self-pay | Admitting: *Deleted

## 2015-11-10 DIAGNOSIS — O26892 Other specified pregnancy related conditions, second trimester: Secondary | ICD-10-CM | POA: Diagnosis not present

## 2015-11-10 DIAGNOSIS — R110 Nausea: Secondary | ICD-10-CM | POA: Diagnosis not present

## 2015-11-10 DIAGNOSIS — Z825 Family history of asthma and other chronic lower respiratory diseases: Secondary | ICD-10-CM | POA: Insufficient documentation

## 2015-11-10 DIAGNOSIS — Z8249 Family history of ischemic heart disease and other diseases of the circulatory system: Secondary | ICD-10-CM | POA: Diagnosis not present

## 2015-11-10 DIAGNOSIS — R1084 Generalized abdominal pain: Secondary | ICD-10-CM | POA: Insufficient documentation

## 2015-11-10 DIAGNOSIS — Z3A25 25 weeks gestation of pregnancy: Secondary | ICD-10-CM | POA: Insufficient documentation

## 2015-11-10 LAB — BASIC METABOLIC PANEL
Anion gap: 7 (ref 5–15)
BUN: 8 mg/dL (ref 6–20)
CHLORIDE: 105 mmol/L (ref 101–111)
CO2: 24 mmol/L (ref 22–32)
Calcium: 9.2 mg/dL (ref 8.9–10.3)
Creatinine, Ser: 0.64 mg/dL (ref 0.44–1.00)
GFR calc Af Amer: >60 mL/min (ref >60)
GFR calc non Af Amer: >60 mL/min (ref >60)
GLUCOSE: 101 mg/dL — AB (ref 65–99)
POTASSIUM: 3.9 mmol/L (ref 3.5–5.1)
Sodium: 136 mmol/L (ref 135–145)

## 2015-11-10 LAB — URINALYSIS, ROUTINE W REFLEX MICROSCOPIC
BILIRUBIN URINE: NEGATIVE
GLUCOSE, UA: NEGATIVE mg/dL
Hgb urine dipstick: NEGATIVE
Ketones, ur: NEGATIVE mg/dL
Leukocytes, UA: NEGATIVE
NITRITE: NEGATIVE
PH: 7.5 (ref 5.0–8.0)
Protein, ur: NEGATIVE mg/dL
SPECIFIC GRAVITY, URINE: 1.02 (ref 1.005–1.030)

## 2015-11-10 LAB — CBC
HCT: 29.3 % — ABNORMAL LOW (ref 36.0–46.0)
Hemoglobin: 9.7 g/dL — ABNORMAL LOW (ref 12.0–15.0)
MCH: 27.3 pg (ref 26.0–34.0)
MCHC: 33.1 g/dL (ref 30.0–36.0)
MCV: 82.5 fL (ref 78.0–100.0)
PLATELETS: 254 10*3/uL (ref 150–400)
RBC: 3.55 MIL/uL — AB (ref 3.87–5.11)
RDW: 13.8 % (ref 11.5–15.5)
WBC: 11.8 10*3/uL — ABNORMAL HIGH (ref 4.0–10.5)

## 2015-11-10 MED ORDER — LACTATED RINGERS IV BOLUS (SEPSIS): 1000.0000 mL | Freq: Once | INTRAVENOUS | Status: DC

## 2015-11-10 MED ORDER — ONDANSETRON 8 MG PO TBDP: 8.0000 mg | ORAL_TABLET | Freq: Three times a day (TID) | ORAL | 3 refills | Status: DC | PRN

## 2015-11-10 MED ORDER — ONDANSETRON 8 MG PO TBDP
8.0000 mg | ORAL_TABLET | Freq: Once | ORAL | Status: AC
  Administered 2015-11-10: 8 mg via ORAL
  Filled 2015-11-10: qty 1

## 2015-11-10 NOTE — MAU Note (Addendum)
Vomiting since Saturday night, denies diarrhea.  Noted blood in emesis Saturday.  Upper abd pain started today.  Pt felt very dizzy like she was going to pass out around 1200 today, did not lose consciousness.  Pt states she has eaten & drank today.  Denies bleeding or LOF.  Pt also states she has had no FM today.

## 2015-11-10 NOTE — Progress Notes (Signed)
20 y.o. year old female,at 1183w3d gestation.  SUBJECTIVE:  Feels bettter.  OBJECTIVE:  BP 125/68 (BP Location: Right Arm)   Pulse 91   Temp 98.8 F (37.1 C) (Oral)   Resp 18   LMP 05/06/2015   SpO2 99%   Fetal Heart Tones:  Stable  ASSESSMENT:  1783w3d Weeks Pregnancy  Improved  PLAN:  Discharge from MAU  Follow-up in the office in two days.  Leonard SchwartzArthur Vernon Tziporah Knoke, M.D. 11/10/2015 5:23 pm

## 2015-11-10 NOTE — Discharge Instructions (Signed)

## 2015-11-10 NOTE — MAU Provider Note (Signed)
DATE: 11/10/2015  Maternity Admissions Unit History and Physical Exam for an Obstetrics Patient  Ms. Courtney Mitchell is a 20 y.o. female, G3P0020, at 3367w3d gestation, who presents for evaluation of malaise, nausea, and generalized abdominal pain. [facility] has been [facility]. The patient complains of nausea and fatigue that started today. She denies vaginal bleeding and rupture membranes. She denies diarrhea. She has not gotten her flu shot. She was able to keep breakfast today. She ate only a small amount of food for lunch. See history below.  OB History    Gravida Para Term Preterm AB Living   3 0 0 0 2 0   SAB TAB Ectopic Multiple Live Births   2 0 0 0        Past Medical History:  Diagnosis Date  . Anemia   . Heart murmur 03/01/2015  . Menorrhagia 08/03/2011  . Trichomoniasis     Prescriptions Prior to Admission  Medication Sig Dispense Refill Last Dose  . Phenylephrine-DM-GG-APAP (TYLENOL COLD/FLU SEVERE) 5-10-200-325 MG TABS Take 1 tablet by mouth daily as needed (took for cold).   11/09/2015 at Unknown time  . prenatal vitamin w/FE, FA (NATACHEW) 29-1 MG CHEW chewable tablet Chew 3 tablets by mouth daily at 12 noon.   11/10/2015 at Unknown time  . Elastic Bandages & Supports (WRIST SPLINT LEFT/RIGHT) MISC 1 Device by Does not apply route at bedtime. 2 each 0   . ferrous sulfate (FERROUSUL) 325 (65 FE) MG tablet Take 1 tablet (325 mg total) by mouth 2 (two) times daily. (Patient not taking: Reported on 09/19/2015) 60 tablet 1 Not Taking at Unknown time    Past Surgical History:  Procedure Laterality Date  . DILATION AND EVACUATION N/A 03/03/2015   Procedure: DILATATION AND EVACUATION;  Surgeon: Silverio LaySandra Rivard, MD;  Location: [facility];  Service: Gynecology;  Laterality: N/A;  . DILATION AND EVACUATION N/A 04/12/2015   Procedure: DILATATION AND EVACUATION;  Surgeon: Osborn CohoAngela Roberts, MD;  Location: [facility];   Service: Gynecology;  Laterality: N/A;  . WISDOM TOOTH EXTRACTION      No Known Allergies  Family History: family history includes Asthma in her father; Cancer in her other; Hypertension in her father.  Social History:  reports that she has never smoked. She has never used smokeless tobacco. She reports that she does not drink alcohol or use drugs.  Review of systems: Normal pregnancy complaints.  Admission Physical Exam:    There is no height or weight on file to calculate BMI.  Blood pressure 125/68, pulse 91, temperature 98.8 F (37.1 C), temperature source Oral, resp. rate 18, last menstrual period 05/06/2015, SpO2 99 %, unknown if currently breastfeeding.  HEENT:                 Within normal limits Chest:                   Clear Heart:                    Regular rate and rhythm Abdomen:             Gravid, mildly tender in all quadrants, no rebound tenderness or guarding Extremities:          Grossly normal  Prenatal labs: ABO, Rh:             O/Positive/-- (08/14 1115)  NST: Category 1; Contractions: None .  Results for orders placed or performed during the hospital encounter of 11/10/15 (from the past 24 hour(s))  Urinalysis, Routine w reflex microscopic (not at [facility])     Status: None   Collection Time: 11/10/15  3:03 PM  Result Value Ref Range   Color, Urine YELLOW YELLOW   APPearance CLEAR CLEAR   Specific Gravity, Urine 1.020 1.005 - 1.030   pH 7.5 5.0 - 8.0   Glucose, UA NEGATIVE NEGATIVE mg/dL   Hgb urine dipstick NEGATIVE NEGATIVE   Bilirubin Urine NEGATIVE NEGATIVE   Ketones, ur NEGATIVE NEGATIVE mg/dL   Protein, ur NEGATIVE NEGATIVE mg/dL   Nitrite NEGATIVE NEGATIVE   Leukocytes, UA NEGATIVE NEGATIVE  CBC     Status: Abnormal   Collection Time: 11/10/15  3:41 PM  Result Value Ref Range   WBC 11.8 (H) 4.0 - 10.5 K/uL   RBC 3.55 (L) 3.87 - 5.11 MIL/uL   Hemoglobin 9.7 (L) 12.0 - 15.0 g/dL   HCT 16.129.3 (L) 09.636.0 - 04.546.0 %   MCV 82.5 78.0 - 100.0 fL   MCH  27.3 26.0 - 34.0 pg   MCHC 33.1 30.0 - 36.0 g/dL   RDW 40.913.8 81.111.5 - 91.415.5 %   Platelets 254 150 - 400 K/uL  Basic metabolic panel     Status: Abnormal   Collection Time: 11/10/15  3:41 PM  Result Value Ref Range   Sodium 136 135 - 145 mmol/L   Potassium 3.9 3.5 - 5.1 mmol/L   Chloride 105 101 - 111 mmol/L   CO2 24 22 - 32 mmol/L   Glucose, Bld 101 (H) 65 - 99 mg/dL   BUN 8 6 - 20 mg/dL   Creatinine, Ser 7.820.64 0.44 - 1.00 mg/dL   Calcium 9.2 8.9 - 95.610.3 mg/dL   GFR calc non Af Amer >60 >60 mL/min   GFR calc Af Amer >60 >60 mL/min   Anion gap 7 5 - 15     Assessment:  327w3d gestation  Nausea  Malaise  Plan:  We will give the patient Zofran to see if that improves her nausea. If she feels better, then we will discharge the patient to home. She will follow-up in the office in 2 days. If her nausea does not improve, then we will begin IV fluids.  We discussed possible etiologies for abdominal pain. There is no direct evidence of appendicitis or any other intra-abdominal process. We will, however, have the patient follow-up in the office in 2 days. She will call if she should develop fever or for abdominal pain should get worse.   Janine LimboSTRINGER,Twylia Oka V 11/10/2015, 4:42 PM

## 2015-12-10 ENCOUNTER — Encounter: Payer: Self-pay | Admitting: Advanced Practice Midwife

## 2015-12-10 ENCOUNTER — Ambulatory Visit (INDEPENDENT_AMBULATORY_CARE_PROVIDER_SITE_OTHER): Payer: Commercial Managed Care - PPO | Admitting: Advanced Practice Midwife

## 2015-12-10 DIAGNOSIS — Z3491 Encounter for supervision of normal pregnancy, unspecified, first trimester: Secondary | ICD-10-CM

## 2015-12-10 LAB — POCT URINALYSIS DIP (DEVICE)
BILIRUBIN URINE: NEGATIVE
GLUCOSE, UA: NEGATIVE mg/dL
Hgb urine dipstick: NEGATIVE
KETONES UR: NEGATIVE mg/dL
LEUKOCYTES UA: NEGATIVE
NITRITE: NEGATIVE
PH: 7 (ref 5.0–8.0)
Protein, ur: NEGATIVE mg/dL
Specific Gravity, Urine: 1.02 (ref 1.005–1.030)
Urobilinogen, UA: 1 mg/dL (ref 0.0–1.0)

## 2015-12-10 NOTE — Progress Notes (Signed)
Pt doesn't have time to stay for her 2 hr gtt today, will return for 28 week labs tomorrow morning.

## 2015-12-10 NOTE — Progress Notes (Signed)
   PRENATAL VISIT NOTE  Subjective:  Courtney Mitchell is a 20 y.o. G3P0020 at 46w5dbeing seen today for ongoing prenatal care   (She is listed as a NEW OB but she [facility] not like [facility] [place], and came here to see midwives for a waterbirth)   She is currently monitored for the following issues for this low-risk pregnancy and has Unspecified deficiency anemia; Heart murmur; Supervision of normal pregnancy in first trimester; Trichomonal vaginitis during pregnancy; BMI 31.0-31.9,adult; Obesity in pregnancy; Anemia affecting pregnancy, antepartum; and Headache in pregnancy, antepartum on her problem list.  Patient reports no complaints.  Contractions: Not present. Vag. Bleeding: None.  Movement: Present. Denies leaking of fluid.   The following portions of the patient's history were reviewed and updated as appropriate: allergies, current medications, past family history, past medical history, past social history, past surgical history and problem list. Problem list updated.  Objective:   Vitals:   12/10/15 0807  BP: 123/84  Pulse: 94  Weight: 189 lb (85.7 kg)    Fetal Status: Fetal Heart Rate (bpm): 156  Fundal Height: 30 cm Movement: Present     General:  Alert, oriented and cooperative. Patient is in no acute distress.  Skin: Skin is warm and dry. No rash noted.   Cardiovascular: Normal heart rate noted  Respiratory: Normal respiratory effort, no problems with respiration noted  Abdomen: Soft, gravid, appropriate for gestational age. Pain/Pressure: Absent     Pelvic:  Cervical exam performed        Extremities: Normal range of motion.  Edema: None  Mental Status: Normal mood and affect. Normal behavior. Normal judgment and thought content.   Assessment and Plan:  Pregnancy: [id number] 251w5d1. Encounter for supervision of normal pregnancy in first  trimester, unspecified gravidity  Doing well    States [facility] [employer].   Plans to go to class soon. Thinking About [employer]?  You must attend a [employer] at [facility] certificate from the class  [employer]upplies needed for [facility] patients:  Our practice has a [employer] manag[employer] a Box tub at the hospital that you can borrow  You will need to purchase an accessory kit that has all needed supplies through [facility]  ) or online  Or you can purchase the supplies separately: o   [employer] tubs for ~ $120 if you would rather purchase your own tub  [email or web address]) $275 for tub rental/set-up & take down/kit   Things that would prevent you from having a waterbirth:  Premature, <37wks  Previous cesarean birth  Presence of thick meconium-stained fluid  Multiple gestation (Twins, triplets, etc.)  Uncontrolled diabetes  Hypertension  Heavy vaginal bleeding  Non-reassuring fetal heart rate  Active infection (MRSA, etc.)  If your labor has to be induced  Other risk issues identified by your obstetrical provider   Preterm labor symptoms and general obstetric precautions including but not limited to vaginal bleeding, contractions, leaking of fluid and fetal movement were reviewed in detail with the patient. Please refer to After Visit Summary for other counseling recommendations.  Return in about 2 weeks (around 12/24/2015) [facility]  [facility]

## 2015-12-10 NOTE — Patient Instructions (Signed)
Third Trimester of Pregnancy The third trimester is from week 29 through week 40 (months 7 through 9). The third trimester is a time when the unborn baby (fetus) is growing rapidly. At the end of the ninth month, the fetus is about 20 inches in length and weighs 6-10 pounds. Body changes during your third trimester Your body goes through many changes during pregnancy. The changes vary from woman to woman. During the third trimester:  Your weight will continue to increase. You can expect to gain 25-35 pounds (11-16 kg) by the end of the pregnancy.  You may begin to get stretch marks on your hips, abdomen, and breasts.  You may urinate more often because the fetus is moving lower into your pelvis and pressing on your bladder.  You may develop or continue to have heartburn. This is caused by increased hormones that slow down muscles in the digestive tract.  You may develop or continue to have constipation because increased hormones slow digestion and cause the muscles that push waste through your intestines to relax.  You may develop hemorrhoids. These are swollen veins (varicose veins) in the rectum that can itch or be painful.  You may develop swollen, bulging veins (varicose veins) in your legs.  You may have increased body aches in the pelvis, back, or thighs. This is due to weight gain and increased hormones that are relaxing your joints.  You may have changes in your hair. These can include thickening of your hair, rapid growth, and changes in texture. Some women also have hair loss during or after pregnancy, or hair that feels dry or thin. Your hair will most likely return to normal after your baby is born.  Your breasts will continue to grow and they will continue to become tender. A yellow fluid (colostrum) may leak from your breasts. This is the first milk you are producing for your baby.  Your belly button may stick out.  You may notice more swelling in your hands, face, or  ankles.  You may have increased tingling or numbness in your hands, arms, and legs. The skin on your belly may also feel numb.  You may feel short of breath because of your expanding uterus.  You may have more problems sleeping. This can be caused by the size of your belly, increased need to urinate, and an increase in your body's metabolism.  You may notice the fetus "dropping," or moving lower in your abdomen.  You may have increased vaginal discharge.  Your cervix becomes thin and soft (effaced) near your due date. What to expect at prenatal visits You will have prenatal exams every 2 weeks until week 36. Then you will have weekly prenatal exams. During a routine prenatal visit:  You will be weighed to make sure you and the fetus are growing normally.  Your blood pressure will be taken.  Your abdomen will be measured to track your baby's growth.  The fetal heartbeat will be listened to.  Any test results from the previous visit will be discussed.  You may have a cervical check near your due date to see if you have effaced. At around 36 weeks, your health care provider will check your cervix. At the same time, your health care provider will also perform a test on the secretions of the vaginal tissue. This test is to determine if a type of bacteria, Group B streptococcus, is present. Your health care provider will explain this further. Your health care provider may ask you:    What your birth plan is.  How you are feeling.  If you are feeling the baby move.  If you have had any abnormal symptoms, such as leaking fluid, bleeding, severe headaches, or abdominal cramping.  If you are using any tobacco products, including cigarettes, chewing tobacco, and electronic cigarettes.  If you have any questions. Other tests or screenings that may be performed during your third trimester include:  Blood tests that check for low iron levels (anemia).  Fetal testing to check the health,  activity level, and growth of the fetus. Testing is done if you have certain medical conditions or if there are problems during the pregnancy.  Nonstress test (NST). This test checks the health of your baby to make sure there are no signs of problems, such as the baby not getting enough oxygen. During this test, a belt is placed around your belly. The baby is made to move, and its heart rate is monitored during movement. What is false labor? False labor is a condition in which you feel small, irregular tightenings of the muscles in the womb (contractions) that eventually go away. These are called Braxton Hicks contractions. Contractions may last for hours, days, or even weeks before true labor sets in. If contractions come at regular intervals, become more frequent, increase in intensity, or become painful, you should see your health care provider. What are the signs of labor?  Abdominal cramps.  Regular contractions that start at 10 minutes apart and become stronger and more frequent with time.  Contractions that start on the top of the uterus and spread down to the lower abdomen and back.  Increased pelvic pressure and dull back pain.  A watery or bloody mucus discharge that comes from the vagina.  Leaking of amniotic fluid. This is also known as your "water breaking." It could be a slow trickle or a gush. Let your doctor know if it has a color or strange odor. If you have any of these signs, call your health care provider right away, even if it is before your due date. Follow these instructions at home: Eating and drinking  Continue to eat regular, healthy meals.  Do not eat:  Raw meat or meat spreads.  Unpasteurized milk or cheese.  Unpasteurized juice.  Store-made salad.  Refrigerated smoked seafood.  Hot dogs or deli meat, unless they are piping hot.  More than 6 ounces of albacore tuna a week.  Shark, swordfish, king mackerel, or tile fish.  Store-made salads.  Raw  sprouts, such as mung bean or alfalfa sprouts.  Take prenatal vitamins as told by your health care provider.  Take 1000 mg of calcium daily as told by your health care provider.  If you develop constipation:  Take over-the-counter or prescription medicines.  Drink enough fluid to keep your urine clear or pale yellow.  Eat foods that are high in fiber, such as fresh fruits and vegetables, whole grains, and beans.  Limit foods that are high in fat and processed sugars, such as fried and sweet foods. Activity  Exercise only as directed by your health care provider. Healthy pregnant women should aim for 2 hours and 30 minutes of moderate exercise per week. If you experience any pain or discomfort while exercising, stop.  Avoid heavy lifting.  Do not exercise in extreme heat or humidity, or at high altitudes.  Wear low-heel, comfortable shoes.  Practice good posture.  Do not travel far distances unless it is absolutely necessary and only with the approval   of your health care provider.  Wear your seat belt at all times while in a car, on a bus, or on a plane.  Take frequent breaks and rest with your legs elevated if you have leg cramps or low back pain.  Do not use hot tubs, steam rooms, or saunas.  You may continue to have sex unless your health care provider tells you otherwise. Lifestyle  Do not use any products that contain nicotine or tobacco, such as cigarettes and e-cigarettes. If you need help quitting, ask your health care provider.  Do not drink alcohol.  Do not use any medicinal herbs or unprescribed drugs. These chemicals affect the formation and growth of the baby.  If you develop varicose veins:  Wear support pantyhose or compression stockings as told by your healthcare provider.  Elevate your feet for 15 minutes, 3-4 times a day.  Wear a supportive maternity bra to help with breast tenderness. General instructions  Take over-the-counter and prescription  medicines only as told by your health care provider. There are medicines that are either safe or unsafe to take during pregnancy.  Take warm sitz baths to soothe any pain or discomfort caused by hemorrhoids. Use hemorrhoid cream or witch hazel if your health care provider approves.  Avoid cat litter boxes and soil used by cats. These carry germs that can cause birth defects in the baby. If you have a cat, ask someone to clean the litter box for you.  To prepare for the arrival of your baby:  Take prenatal classes to understand, practice, and ask questions about the labor and delivery.  Make a trial run to the hospital.  Visit the hospital and tour the maternity area.  Arrange for maternity or paternity leave through employers.  Arrange for family and friends to take care of pets while you are in the hospital.  Purchase a rear-facing car seat and make sure you know how to install it in your car.  Pack your hospital bag.  Prepare the baby's nursery. Make sure to remove all pillows and stuffed animals from the baby's crib to prevent suffocation.  Visit your dentist if you have not gone during your pregnancy. Use a soft toothbrush to brush your teeth and be gentle when you floss.  Keep all prenatal follow-up visits as told by your health care provider. This is important. Contact a health care provider if:  You are unsure if you are in labor or if your water has broken.  You become dizzy.  You have mild pelvic cramps, pelvic pressure, or nagging pain in your abdominal area.  You have lower back pain.  You have persistent nausea, vomiting, or diarrhea.  You have an unusual or bad smelling vaginal discharge.  You have pain when you urinate. Get help right away if:  You have a fever.  You are leaking fluid from your vagina.  You have spotting or bleeding from your vagina.  You have severe abdominal pain or cramping.  You have rapid weight loss or weight gain.  You have  shortness of breath with chest pain.  You notice sudden or extreme swelling of your face, hands, ankles, feet, or legs.  Your baby makes fewer than 10 movements in 2 hours.  You have severe headaches that do not go away with medicine.  You have vision changes. Summary  The third trimester is from week 29 through week 40, months 7 through 9. The third trimester is a time when the unborn baby (fetus)   is growing rapidly.  During the third trimester, your discomfort may increase as you and your baby continue to gain weight. You may have abdominal, leg, and back pain, sleeping problems, and an increased need to urinate.  During the third trimester your breasts will keep growing and they will continue to become tender. A yellow fluid (colostrum) may leak from your breasts. This is the first milk you are producing for your baby.  False labor is a condition in which you feel small, irregular tightenings of the muscles in the womb (contractions) that eventually go away. These are called Braxton Hicks contractions. Contractions may last for hours, days, or even weeks before true labor sets in.  Signs of labor can include: abdominal cramps; regular contractions that start at 10 minutes apart and become stronger and more frequent with time; watery or bloody mucus discharge that comes from the vagina; increased pelvic pressure and dull back pain; and leaking of amniotic fluid. This information is not intended to replace advice given to you by your health care provider. Make sure you discuss any questions you have with your health care provider. Document Released: 12/15/2000 Document Revised: 05/29/2015 Document Reviewed: 02/22/2012 Elsevier Interactive Patient Education  2017 [employer] Inc[employer]  

## 2015-12-23 ENCOUNTER — Inpatient Hospital Stay (HOSPITAL_COMMUNITY)
Admission: AD | Admit: 2015-12-23 | Discharge: 2015-12-23 | Disposition: A | Discharge status: Home / Self Care | Payer: Commercial Managed Care - PPO | Source: Ambulatory Visit | Attending: Obstetrics and Gynecology | Admitting: Obstetrics and Gynecology

## 2015-12-23 ENCOUNTER — Encounter (HOSPITAL_COMMUNITY): Payer: Self-pay | Admitting: *Deleted

## 2015-12-23 DIAGNOSIS — Z3A32 32 weeks gestation of pregnancy: Secondary | ICD-10-CM | POA: Diagnosis not present

## 2015-12-23 DIAGNOSIS — O99513 Diseases of the respiratory system complicating pregnancy, third trimester: Secondary | ICD-10-CM | POA: Insufficient documentation

## 2015-12-23 DIAGNOSIS — J069 Acute upper respiratory infection, unspecified: Secondary | ICD-10-CM | POA: Insufficient documentation

## 2015-12-23 DIAGNOSIS — R05 Cough: Secondary | ICD-10-CM | POA: Diagnosis present

## 2015-12-23 DIAGNOSIS — O368130 Decreased fetal movements, third trimester, not applicable or unspecified: Secondary | ICD-10-CM | POA: Insufficient documentation

## 2015-12-23 LAB — INFLUENZA PANEL BY PCR (TYPE A & B)
Influenza A By PCR: NEGATIVE
Influenza B By PCR: NEGATIVE

## 2015-12-23 LAB — RAPID STREP SCREEN (MED CTR MEBANE ONLY): STREPTOCOCCUS, GROUP A SCREEN (DIRECT): NEGATIVE

## 2015-12-23 MED ORDER — DIPHENHYDRAMINE HCL 25 MG PO CAPS: 25.0000 mg | ORAL_CAPSULE | Freq: Four times a day (QID) | ORAL | 0 refills | Status: DC | PRN

## 2015-12-23 MED ORDER — ACETAMINOPHEN 325 MG PO TABS: 650.0000 mg | ORAL_TABLET | Freq: Four times a day (QID) | ORAL | 0 refills | Status: DC | PRN

## 2015-12-23 MED ORDER — MENTHOL 3 MG MT LOZG
1.0000 | LOZENGE | OROMUCOSAL | Status: DC | PRN
  Administered 2015-12-23: 3 mg via ORAL
  Filled 2015-12-23: qty 9

## 2015-12-23 NOTE — MAU Provider Note (Signed)
Chief Complaint:  Cough; Nasal Congestion; and Decreased Fetal Movement   First Provider Initiated Contact with Patient 12/23/15 1826     HPI: Courtney Mitchell is a 20 y.o. G3P0020 at [id number] who presents to maternity admissions reporting congestion, sore throat, cough, nose bleed x 1, scant blood in sputum x 1 since yesterday and decreased fetal mvmt today.   Duration: 24 hours Course: worsening Modifying factors: Hasn't tried anything for Sx. Doesn't know what is safe in pregnancy.  Associated signs and symptoms: Pos for fatigue. Neg for fever, chills, N/V/D, sick contacts.  Denies contractions, leakage of fluid or vaginal bleeding.   Past Medical History:  Diagnosis Date  . Anemia   . Heart murmur 03/01/2015  . Menorrhagia 08/03/2011  . Trichomoniasis    OB History  Gravida Para Term Preterm AB Living  3 0 0 0 2 0  SAB TAB Ectopic Multiple Live Births  2 0 0 0      # Outcome Date GA Lbr Len/2nd Weight Sex Delivery Anes PTL Lv  3 Current           2 SAB 2014             Complications: Retained products of conception not following abortion  1 SAB              Past Surgical History:  Procedure Laterality Date  . DILATION AND EVACUATION N/A 03/03/2015   Procedure: DILATATION AND EVACUATION;  Surgeon: Silverio LaySandra Rivard, MD;  Location: [facility];  Service: Gynecology;  Laterality: N/A;  . DILATION AND EVACUATION N/A 04/12/2015   Procedure: DILATATION AND EVACUATION;  Surgeon: Osborn CohoAngela Roberts, MD;  Location: [facility];  Service: Gynecology;  Laterality: N/A;  . WISDOM TOOTH EXTRACTION     Family History  Problem Relation Age of Onset  . Cancer Other   . Asthma Father   . Hypertension Father    Social History  Substance Use Topics  . Smoking status: Never Smoker  . Smokeless tobacco: Never Used  . Alcohol use No   No Known Allergies No prescriptions prior to admission.    I have reviewed patient's Past Medical Hx, Surgical Hx, Family Hx, Social Hx, medications and allergies.   ROS:   Review of Systems  Constitutional: Positive for fatigue. Negative for chills, diaphoresis and fever.  HENT: Positive for congestion, rhinorrhea, sneezing and sore throat. Negative for sinus pain and trouble swallowing.   Respiratory: Positive for cough. Negative for shortness of breath and wheezing.   Gastrointestinal: Negative for abdominal pain, diarrhea, nausea and vomiting.  Genitourinary: Negative for vaginal bleeding.  Musculoskeletal: Negative for neck pain and neck stiffness.  Neurological: Negative for headaches.    Physical Exam  No data found. BP 132/79 (BP Location: Right Arm)   Pulse 98   Temp 97.7 F (36.5 C) (Oral)   Resp 20   LMP 05/06/2015   Constitutional: Well-developed, well-nourished female in no acute distress. Tired-appearing Head: Throat mildly erythematous, no exudate. Tonsils Nml.  Cardiovascular: normal rate, No MRG Respiratory: normal effort. CTAB GI: Abd soft, non-tender, gravid appropriate for gestational age.  MS: Extremities nontender, no edema, normal ROM Neurologic: Alert and oriented x 4.  GU: Neg CVAT.  Pelvic: NEFG, physiologic discharge, no blood, cervix clean. No CMT     FHT:  Baseline 145 , moderate variability, accelerations present, no decelerations Contractions: None   Labs: Results for orders placed or performed during the hospital encounter of 12/23/15 (from the past 168 hour(s))  Rapid  strep screen (not at [facility])   Collection Time: 12/23/15  6:59 PM  Result Value Ref Range   Streptococcus, Group A Screen (Direct) NEGATIVE NEGATIVE  Culture, group A strep   Collection Time: 12/23/15  6:59 PM  Result Value Ref Range   Specimen Description THROAT    Special Requests NONE Reflexed from [id number]    Culture      NO GROUP A STREP (S.PYOGENES) ISOLATED [facility]    Report Status 12/26/2015 FINAL   Influenza panel by PCR (type A & B, H1N1)   Collection Time: 12/23/15  6:59 PM  Result Value Ref Range    Influenza A By PCR NEGATIVE NEGATIVE   Influenza B By PCR NEGATIVE NEGATIVE   Imaging:  No results found.  MAU Course: Orders Placed This Encounter  Procedures  . Rapid strep screen (not at [facility])  . Culture, group A strep  . Influenza panel by PCR (type A & B, H1N1)   Meds ordered this encounter  Medications  . DISCONTD: acetaminophen (TYLENOL) 325 MG tablet    Sig: Take 650 mg by mouth every 6 (six) hours as needed for mild pain or headache.  Marland Kitchen. DISCONTD: menthol-cetylpyridinium (CEPACOL) lozenge 3 mg  . acetaminophen (TYLENOL) 325 MG tablet    Sig: Take 2 tablets (650 mg total) by mouth every 6 (six) hours as needed for mild pain or headache.    Dispense:  30 tablet    Refill:  0    Order Specific Question:   Supervising Provider    Answer:   Reva BoresPRATT, TANYA S [[id number]]  . diphenhydrAMINE (BENADRYL) 25 mg capsule    Sig: Take 1-2 capsules (25-50 mg total) by mouth every 6 (six) hours as needed for sleep (or Rhinorrhea).    Dispense:  30 capsule    Refill:  0    Order Specific Question:   Supervising Provider    Answer:   Samara SnidePRATT, TANYA S [[id number]]    MDM: - Viral URI. Flu and strep A neg.  - Decreased fetal mvmt, resolved. FHR reactive.  Assessment: 1. URI with cough and congestion   2. Decreased fetal movements in third trimester, single or unspecified fetus     Plan: Discharge home in stable condition.  Preterm Labor precautions and fetal kick counts Increase fluids and rest.  OTC cold meds discussed Follow-up Information    [facility].   Specialty:  Obstetrics and Gynecology Why:  as scheduled for prenatal visit Contact information: [address], [address] [place] [id number] [phone]       Emeterio ReeveWOLTERS,SHARON A, MD Follow up.   Specialty:  Family Medicine Why:  as needed if respiratory infection worsens or does not improve in 7-10 days Contact information: [address] [place]  [id number] [phone]        [facility] Follow up.   Why:  as needed in pregnancy emergencies Contact information: [address] [id number] [place] [phone] [phone]          Allergies as of 12/23/2015   No Known Allergies     Medication List    STOP taking these medications   ferrous sulfate 325 (65 FE) MG tablet Commonly known as:  FERROUSUL   ondansetron 8 MG disintegrating tablet Commonly known as:  ZOFRAN ODT     TAKE these medications   acetaminophen 325 MG tablet Commonly known as:  TYLENOL  Take 2 tablets (650 mg total) by mouth every 6 (six) hours as needed for mild pain or headache.   diphenhydrAMINE 25 mg capsule Commonly known as:  BENADRYL Take 1-2 capsules (25-50 mg total) by mouth every 6 (six) hours as needed for sleep (or Rhinorrhea).   prenatal vitamin w/FE, FA 29-1 MG Chew chewable tablet Chew 3 tablets by mouth daily at 12 noon.   TYLENOL COLD/FLU SEVERE 5-10-200-325 MG Tabs Generic drug:  Phenylephrine-DM-GG-APAP Take 1 tablet by mouth daily as needed (took for cold).       [place], CNM 12/30/2015 12:44 AM

## 2015-12-23 NOTE — MAU Note (Signed)
Pt states she has been sick for 2 days - has sharp pain in throat when breathing in, has pain in ears, coughing - had small nosebleed last night, has noted blood in sputum.  Decreased FM today, slight abd pain, denies bleeding or LOF.

## 2015-12-23 NOTE — MAU Note (Signed)
Urine sent to lab 

## 2015-12-23 NOTE — Discharge Instructions (Signed)
Upper Respiratory Infection, Adult Most upper respiratory infections (URIs) are a viral infection of the air passages leading to the lungs. A URI affects the nose, throat, and upper air passages. The most common type of URI is nasopharyngitis and is typically referred to as "the common cold." URIs run their course and usually go away on their own. Most of the time, a URI does not require medical attention, but sometimes a bacterial infection in the upper airways can follow a viral infection. This is called a secondary infection. Sinus and middle ear infections are common types of secondary upper respiratory infections. Bacterial pneumonia can also complicate a URI. A URI can worsen asthma and chronic obstructive pulmonary disease (COPD). Sometimes, these complications can require emergency medical care and may be life threatening. What are the causes? Almost all URIs are caused by viruses. A virus is a type of germ and can spread from one person to another. What increases the risk? You may be at risk for a URI if:  You smoke.  You have chronic heart or lung disease.  You have a weakened defense (immune) system.  You are very young or very old.  You have nasal allergies or asthma.  You work in crowded or poorly ventilated areas.  You work in health care facilities or schools.  What are the signs or symptoms? Symptoms typically develop 2-3 days after you come in contact with a cold virus. Most viral URIs last 7-10 days. However, viral URIs from the influenza virus (flu virus) can last 14-18 days and are typically more severe. Symptoms may include:  Runny or stuffy (congested) nose.  Sneezing.  Cough.  Sore throat.  Headache.  Fatigue.  Fever.  Loss of appetite.  Pain in your forehead, behind your eyes, and over your cheekbones (sinus pain).  Muscle aches.  How is this diagnosed? Your health care provider may diagnose a URI by:  Physical exam.  Tests to check that your  symptoms are not due to another condition such as: ? Strep throat. ? Sinusitis. ? Pneumonia. ? Asthma.  How is this treated? A URI goes away on its own with time. It cannot be cured with medicines, but medicines may be prescribed or recommended to relieve symptoms. Medicines may help:  Reduce your fever.  Reduce your cough.  Relieve nasal congestion.  Follow these instructions at home:  Take medicines only as directed by your health care provider.  Gargle warm saltwater or take cough drops to comfort your throat as directed by your health care provider.  Use a warm mist humidifier or inhale steam from a shower to increase air moisture. This may make it easier to breathe.  Drink enough fluid to keep your urine clear or pale yellow.  Eat soups and other clear broths and maintain good nutrition.  Rest as needed.  Return to work when your temperature has returned to normal or as your health care provider advises. You may need to stay home longer to avoid infecting others. You can also use a face mask and careful hand washing to prevent spread of the virus.  Increase the usage of your inhaler if you have asthma.  Do not use any tobacco products, including cigarettes, chewing tobacco, or electronic cigarettes. If you need help quitting, ask your health care provider. How is this prevented? The best way to protect yourself from getting a cold is to practice good hygiene.  Avoid oral or hand contact with people with cold symptoms.  Wash your   hands often if contact occurs.  There is no clear evidence that vitamin C, vitamin E, echinacea, or exercise reduces the chance of developing a cold. However, it is always recommended to get plenty of rest, exercise, and practice good nutrition. Contact a health care provider if:  You are getting worse rather than better.  Your symptoms are not controlled by medicine.  You have chills.  You have worsening shortness of breath.  You have  brown or red mucus.  You have yellow or brown nasal discharge.  You have pain in your face, especially when you bend forward.  You have a fever.  You have swollen neck glands.  You have pain while swallowing.  You have white areas in the back of your throat. Get help right away if:  You have severe or persistent: ? Headache. ? Ear pain. ? Sinus pain. ? Chest pain.  You have chronic lung disease and any of the following: ? Wheezing. ? Prolonged cough. ? Coughing up blood. ? A change in your usual mucus.  You have a stiff neck.  You have changes in your: ? Vision. ? Hearing. ? Thinking. ? Mood. This information is not intended to replace advice given to you by your health care provider. Make sure you discuss any questions you have with your health care provider. Document Released: 06/16/2000 Document Revised: 08/24/2015 Document Reviewed: 03/28/2013 Elsevier Interactive Patient Education  2017 [employer].  

## 2015-12-25 ENCOUNTER — Encounter: Payer: Self-pay | Admitting: Family

## 2015-12-25 ENCOUNTER — Ambulatory Visit (INDEPENDENT_AMBULATORY_CARE_PROVIDER_SITE_OTHER): Payer: Commercial Managed Care - PPO | Admitting: Certified Nurse Midwife

## 2015-12-25 DIAGNOSIS — Z3491 Encounter for supervision of normal pregnancy, unspecified, first trimester: Secondary | ICD-10-CM

## 2015-12-25 DIAGNOSIS — Z3493 Encounter for supervision of normal pregnancy, unspecified, third trimester: Secondary | ICD-10-CM

## 2015-12-25 NOTE — Patient Instructions (Signed)
AREA PEDIATRIC/FAMILY PRACTICE PHYSICIANS  [facility] [address], [address] [place], [place]  [address] Phone - [phone]   Fax - [phone]  [facility] [address] [place], [place] [address] Phone - [phone]   Fax - [phone]  [facility] [address] [place], [place]  [address] Phone - [phone]   Fax - [phone]  [facility] [address] [place], [place]  [address] Phone - [phone]   Fax - [phone]  [facility] [address] [place], [place]  [address] Phone - [phone]   Fax - [phone]  [facility] [address], [address] [place], [place]  [address] Phone - [phone]   Fax - [phone]  [facility] [address] [place], [place]  [address] Phone - [phone]   Fax - [phone]  [facility] [address], [address] [place], [place]  [address] Phone - [phone]   Fax - [phone]  [facility] [address] [place], [place]  [address] Phone - [phone]   Fax - [phone] [facility] [address] [place], [place]  [address] Phone - [phone]   Fax - [phone]  [facility] [address] [place], [place]  [address] Phone - [phone]   Fax - [phone]  [facility] [address] [place], [place]  [address] Phone - [phone]   Fax - [phone]  [facility] [address], [address] [place], [place]  [address] Phone - [phone]   Fax - [phone]  [facility]IL[facility] GOSRANI [address] [place], [place]  [address] Phone - [phone]  [facility] [address] [place], [place]  [address] Phone - [phone]   Fax - [phone]  [facility] [address] [place], [place]  [address] Phone - [phone]   Fax - [phone]  [facility] [address] [place], [place]  [address] Phone - [phone]   Fax - [phone]  [facility] [address] [place], [place]  [address] Phone - [phone]   Fax - [phone]   [facility] [address] [place], [place]  [address] Phone - [phone]   Fax - [phone]  [facility] [address] [place], [place]  [address] Phone - [phone]   Fax - [phone]  [facility] RUB[facility] [address] [place], [place]  [address] Phone - [phone]   Fax - [phone]  [facility] [address] , [place]  [address] Phone - [phone]   Fax - [phone]  [facility] [address] , [place]  [address] Phone - [phone]   Fax - [phone] [facility] [address] [place], [place]  [address] Phone - [phone]   Fax - [phone]  [facility] [address] [place], [place]  [address] Phone - [phone]   Fax - [phone]  [facility] [address] [place],   [address] Phone - [phone]   Fax - [phone]   

## 2015-12-25 NOTE — Progress Notes (Signed)
Subjective:  Courtney Mitchell is a 20 y.o. G3P0020 at 4055w6d being seen today for ongoing prenatal care.  She is currently monitored for the following issues for this low-risk pregnancy and has Unspecified deficiency anemia; Heart murmur; Supervision of normal pregnancy in first trimester; Trichomonal vaginitis during pregnancy; BMI 31.0-31.9,adult; Obesity in pregnancy; Anemia affecting pregnancy, antepartum; and Headache in pregnancy, antepartum on her problem list.  Patient reports no complaints.  Contractions: Irritability. Vag. Bleeding: None.  Movement: Present. Denies leaking of fluid.   The following portions of the patient's history were reviewed and updated as appropriate: allergies, current medications, past family history, past medical history, past social history, past surgical history and problem list. Problem list updated.  Objective:   Vitals:   12/25/15 0810  BP: 109/74  Pulse: 100  Weight: 191 lb 8 oz (86.9 kg)    Fetal Status: Fetal Heart Rate (bpm): 144 Fundal Height: 32 cm Movement: Present  Presentation: Vertex  General:  Alert, oriented and cooperative. Patient is in no acute distress.  Skin: Skin is warm and dry. No rash noted.   Cardiovascular: Normal heart rate noted  Respiratory: Normal respiratory effort, no problems with respiration noted  Abdomen: Soft, gravid, appropriate for gestational age. Pain/Pressure: Present     Pelvic: Vag. Bleeding: None Vag D/C Character: White   Cervical exam deferred        Extremities: Normal range of motion.  Edema: None  Mental Status: Normal mood and affect. Normal behavior. Normal judgment and thought content.   Urinalysis:      Assessment and Plan:  Pregnancy: G3P0020 at 5855w6d  1. Encounter for supervision of normal pregnancy in first trimester, unspecified gravidity -planning waterbirth, need to bring certificate to nv  Preterm labor symptoms and general obstetric precautions including but not limited to vaginal  bleeding, contractions, leaking of fluid and fetal movement were reviewed in detail with the patient. Please refer to After Visit Summary for other counseling recommendations.  Return in about 2 weeks (around 01/08/2016).   Donette LarryMelanie Shannan Slinker, CNM

## 2015-12-26 LAB — CULTURE, GROUP A STREP (THRC)

## 2016-01-05 NOTE — L&D Delivery Note (Signed)
20 y.o. G3P0020 at 4065w4d delivered a viable female infant in cephalic, LOA position. Loose nuchal cord x1,easily reduced at perineum. Right anterior shoulder delivered with ease. 120 sec delayed cord clamping. Cord clamped x2 and cut. Placenta delivered spontaneously intact, with 3VC. Fundus firm on exam with massage and pitocin. 0.2 mg Methergine given 2/2 bleeding. Good hemostasis noted afterwards.  Anesthesia: Epidural Laceration: None Suture: N/A Good hemostasis noted. EBL: 200 cc  Mom and baby recovering in LDR.    Apgars: APGAR (1 MIN): 9   APGAR (5 MINS): 9    Weight: Pending skin to skin    Jen MowElizabeth Mumaw, DO OB Fellow [facility] 02/17/2016, 12:15 PM

## 2016-01-08 ENCOUNTER — Ambulatory Visit (INDEPENDENT_AMBULATORY_CARE_PROVIDER_SITE_OTHER): Payer: Commercial Managed Care - PPO | Admitting: Family

## 2016-01-08 VITALS — BP 131/64 | HR 99 | Wt 192.5 lb

## 2016-01-08 DIAGNOSIS — O4693 Antepartum hemorrhage, unspecified, third trimester: Secondary | ICD-10-CM

## 2016-01-08 DIAGNOSIS — O368130 Decreased fetal movements, third trimester, not applicable or unspecified: Secondary | ICD-10-CM

## 2016-01-08 DIAGNOSIS — Z3480 Encounter for supervision of other normal pregnancy, unspecified trimester: Secondary | ICD-10-CM

## 2016-01-08 NOTE — Progress Notes (Signed)
   PRENATAL VISIT NOTE  Subjective:  Courtney Mitchell is a 21 y.o. G3P0020 at 7480w6d being seen today for ongoing prenatal care.  She is currently monitored for the following issues for this low-risk pregnancy and has Unspecified deficiency anemia; Heart murmur; Supervision of normal pregnancy, antepartum; Trichomonal vaginitis during pregnancy; BMI 31.0-31.9,adult; Obesity in pregnancy; Anemia affecting pregnancy, antepartum; and Headache in pregnancy, antepartum on her problem list.  Patient reports spotting of blood yesterday, less than a period.  Denies recent intercourse.  Concerned due to decreased fetal movement.   Contractions: Irritability. Vag. Bleeding: None.  Movement: (!) Decreased. Denies leaking of fluid.   The following portions of the patient's history were reviewed and updated as appropriate: allergies, current medications, past family history, past medical history, past social history, past surgical history and problem list. Problem list updated.  Objective:   Vitals:   01/08/16 1000  BP: 131/64  Pulse: 99  Weight: 192 lb 8 oz (87.3 kg)    Fetal Status: Fetal Heart Rate (bpm): 150 Fundal Height: 34 cm Movement: (!) Decreased     General:  Alert, oriented and cooperative. Patient is in no acute distress.  Skin: Skin is warm and dry. No rash noted.   Cardiovascular: Normal heart rate noted  Respiratory: Normal respiratory effort, no problems with respiration noted  Abdomen: Soft, gravid, appropriate for gestational age. Pain/Pressure: Present     Pelvic:  Cervical exam performed; no bleeding seen; Dilation: Closed Effacement (%): Thick Station: Ballotable  Extremities: Normal range of motion.  Edema: None  Mental Status: Normal mood and affect. Normal behavior. Normal judgment and thought content.   Assessment and Plan:  Pregnancy: G3P0020 at 2680w6d  1. Supervision of other normal pregnancy, antepartum - NST - today > reactive 2.  Decreased fetal movement - NST today >  reactive  3.  Bleeding in 3rd Trimester - Normal exam - Limited US; assess placenta (pt reassurance; extreme concern due to hx of SABx2)  Preterm labor symptoms and general obstetric precautions including but not limited to vaginal bleeding, contractions, leaking of fluid and fetal movement were reviewed in detail with the patient. Please refer to After Visit Summary for other counseling recommendations.  Return in about 2 weeks (around 01/22/2016).   Eino FarberWalidah Kennith GainN Karim, CNM

## 2016-01-08 NOTE — Progress Notes (Signed)
Pt felt good FM during NST. US for placental assessment scheduled 1/5 @ 1445.

## 2016-01-08 NOTE — Progress Notes (Signed)
Pt felt some sharp pains in her abdomen, she noticed pink discharge after this. Movement is a little decreased as well.

## 2016-01-09 ENCOUNTER — Ambulatory Visit (HOSPITAL_COMMUNITY)
Admission: RE | Admit: 2016-01-09 | Discharge: 2016-01-09 | Disposition: A | Discharge status: Home / Self Care | Payer: Commercial Managed Care - PPO | Source: Ambulatory Visit | Attending: Family | Admitting: Family

## 2016-01-09 DIAGNOSIS — O4693 Antepartum hemorrhage, unspecified, third trimester: Secondary | ICD-10-CM

## 2016-01-09 DIAGNOSIS — O468X3 Other antepartum hemorrhage, third trimester: Secondary | ICD-10-CM | POA: Insufficient documentation

## 2016-01-22 ENCOUNTER — Encounter (HOSPITAL_COMMUNITY): Payer: Self-pay | Admitting: *Deleted

## 2016-01-22 ENCOUNTER — Inpatient Hospital Stay (HOSPITAL_COMMUNITY)
Admission: AD | Admit: 2016-01-22 | Discharge: 2016-01-22 | Disposition: A | Discharge status: Home / Self Care | Payer: Commercial Managed Care - PPO | Source: Ambulatory Visit | Attending: Obstetrics & Gynecology | Admitting: Obstetrics & Gynecology

## 2016-01-22 DIAGNOSIS — M549 Dorsalgia, unspecified: Secondary | ICD-10-CM | POA: Diagnosis present

## 2016-01-22 DIAGNOSIS — Z3A35 35 weeks gestation of pregnancy: Secondary | ICD-10-CM | POA: Insufficient documentation

## 2016-01-22 DIAGNOSIS — O4703 False labor before 37 completed weeks of gestation, third trimester: Secondary | ICD-10-CM | POA: Insufficient documentation

## 2016-01-22 DIAGNOSIS — O479 False labor, unspecified: Secondary | ICD-10-CM

## 2016-01-22 LAB — URINALYSIS, ROUTINE W REFLEX MICROSCOPIC
BILIRUBIN URINE: NEGATIVE
Glucose, UA: NEGATIVE mg/dL
HGB URINE DIPSTICK: NEGATIVE
Ketones, ur: NEGATIVE mg/dL
Leukocytes, UA: NEGATIVE
Nitrite: NEGATIVE
Protein, ur: NEGATIVE mg/dL
SPECIFIC GRAVITY, URINE: 1.017 (ref 1.005–1.030)
pH: 6 (ref 5.0–8.0)

## 2016-01-22 NOTE — Discharge Instructions (Signed)
Braxton Hicks Contractions °Contractions of the uterus can occur throughout pregnancy. Contractions are not always a sign that you are in labor.  °WHAT ARE BRAXTON HICKS CONTRACTIONS?  °Contractions that occur before labor are called Braxton Hicks contractions, or false labor. Toward the end of pregnancy (32-34 weeks), these contractions can develop more often and may become more forceful. This is not true labor because these contractions do not result in opening (dilatation) and thinning of the cervix. They are sometimes difficult to tell apart from true labor because these contractions can be forceful and people have different pain tolerances. You should not feel embarrassed if you go to the hospital with false labor. Sometimes, the only way to tell if you are in true labor is for your health care provider to look for changes in the cervix. °If there are no prenatal problems or other health problems associated with the pregnancy, it is completely safe to be sent home with false labor and await the onset of true labor. °HOW CAN YOU TELL THE DIFFERENCE BETWEEN TRUE AND FALSE LABOR? °False Labor  °· The contractions of false labor are usually shorter and not as hard as those of true labor.   °· The contractions are usually irregular.   °· The contractions are often felt in the front of the lower abdomen and in the groin.   °· The contractions may go away when you walk around or change positions while lying down.   °· The contractions get weaker and are shorter lasting as time goes on.   °· The contractions do not usually become progressively stronger, regular, and closer together as with true labor.   °True Labor  °· Contractions in true labor last 30-70 seconds, become very regular, usually become more intense, and increase in frequency.   °· The contractions do not go away with walking.   °· The discomfort is usually felt in the top of the uterus and spreads to the lower abdomen and low back.   °· True labor can be  determined by your health care provider with an exam. This will show that the cervix is dilating and getting thinner.   °WHAT TO REMEMBER °· Keep up with your usual exercises and follow other instructions given by your health care provider.   °· Take medicines as directed by your health care provider.   °· Keep your regular prenatal appointments.   °· Eat and drink lightly if you think you are going into labor.   °· If Braxton Hicks contractions are making you uncomfortable:   °¨ Change your position from lying down or resting to walking, or from walking to resting.   °¨ Sit and rest in a tub of warm water.   °¨ Drink 2-3 glasses of water. Dehydration may cause these contractions.   °¨ Do slow and deep breathing several times an hour.   °WHEN SHOULD I SEEK IMMEDIATE MEDICAL CARE? °Seek immediate medical care if: °· Your contractions become stronger, more regular, and closer together.   °· You have fluid leaking or gushing from your vagina.   °· You have a fever.   °· You pass blood-tinged mucus.   °· You have vaginal bleeding.   °· You have continuous abdominal pain.   °· You have low back pain that you never had before.   °· You feel your baby's head pushing down and causing pelvic pressure.   °· Your baby is not moving as much as it used to.   °This information is not intended to replace advice given to you by your health care provider. Make sure you discuss any questions you have with your health care   provider. °Document Released: 12/21/2004 Document Revised: 04/14/2015 Document Reviewed: 10/02/2012 °Elsevier Interactive Patient Education © 2017 [employer] Inc[employer] ° °

## 2016-01-22 NOTE — MAU Note (Signed)
Pt C/O pelvic pressure since yesterday, is worse today.  Started having uc's around 1400, hurts the most in her back.  Denies bleeding or LOF.

## 2016-01-22 NOTE — MAU Provider Note (Signed)
History     CSN: [id number]  Arrival date and time: 01/22/16 1723   First Provider Initiated Contact with Patient 01/22/16 1851      Chief Complaint  Patient presents with  . pelvic pressure  . Back Pain   HPI   Ms.Courtney Mitchell is a 21 y.o. female G3P0020 @ 4265w6d here in MAU with contractions and pelvic pain. This has been going on for a few days. She was just unsure whether these are labor contractions or braxton hicks contractions.   OB History    Gravida Para Term Preterm AB Living   3 0 0 0 2 0   SAB TAB Ectopic Multiple Live Births   2 0 0 0        Past Medical History:  Diagnosis Date  . Anemia   . Heart murmur 03/01/2015  . Menorrhagia 08/03/2011  . Trichomoniasis     Past Surgical History:  Procedure Laterality Date  . DILATION AND EVACUATION N/A 03/03/2015   Procedure: DILATATION AND EVACUATION;  Surgeon: Silverio LaySandra Rivard, MD;  Location: [facility];  Service: Gynecology;  Laterality: N/A;  . DILATION AND EVACUATION N/A 04/12/2015   Procedure: DILATATION AND EVACUATION;  Surgeon: Osborn CohoAngela Roberts, MD;  Location: [facility];  Service: Gynecology;  Laterality: N/A;  . WISDOM TOOTH EXTRACTION      Family History  Problem Relation Age of Onset  . Cancer Other   . Asthma Father   . Hypertension Father     Social History  Substance Use Topics  . Smoking status: Never Smoker  . Smokeless tobacco: Never Used  . Alcohol use No    Allergies: No Known Allergies  Prescriptions Prior to Admission  Medication Sig Dispense Refill Last Dose  . acetaminophen (TYLENOL) 325 MG tablet Take 2 tablets (650 mg total) by mouth every 6 (six) hours as needed for mild pain or headache. (Patient not taking: Reported on 01/08/2016) 30 tablet 0 Not Taking  . diphenhydrAMINE (BENADRYL) 25 mg capsule Take 1-2 capsules (25-50 mg total) by mouth every 6 (six) hours as needed for sleep (or Rhinorrhea). (Patient not taking: Reported on 01/08/2016) 30 capsule 0 Not Taking  . Phenylephrine-DM-GG-APAP  (TYLENOL COLD/FLU SEVERE) 5-10-200-325 MG TABS Take 1 tablet by mouth daily as needed (took for cold).   Not Taking  . prenatal vitamin w/FE, FA (NATACHEW) 29-1 MG CHEW chewable tablet Chew 3 tablets by mouth daily at 12 noon.   Taking   Results for orders placed or performed during the hospital encounter of 01/22/16 (from the past 48 hour(s))  Urinalysis, Routine w reflex microscopic     Status: Abnormal   Collection Time: 01/22/16  5:45 PM  Result Value Ref Range   Color, Urine YELLOW YELLOW   APPearance HAZY (A) CLEAR   Specific Gravity, Urine 1.017 1.005 - 1.030   pH 6.0 5.0 - 8.0   Glucose, UA NEGATIVE NEGATIVE mg/dL   Hgb urine dipstick NEGATIVE NEGATIVE   Bilirubin Urine NEGATIVE NEGATIVE   Ketones, ur NEGATIVE NEGATIVE mg/dL   Protein, ur NEGATIVE NEGATIVE mg/dL   Nitrite NEGATIVE NEGATIVE   Leukocytes, UA NEGATIVE NEGATIVE    Review of Systems  Genitourinary: Positive for pelvic pain and vaginal pain.   Physical Exam   Blood pressure 127/72, pulse 113, temperature 98.6 F (37 C), temperature source Oral, resp. rate 18, last menstrual period 05/06/2015, unknown if currently breastfeeding.  Physical Exam  Constitutional: She is oriented to person, place, and time. She appears well-developed and well-nourished. No  distress.  HENT:  Head: Normocephalic.  Eyes: Pupils are equal, round, and reactive to light.  Genitourinary:  Genitourinary Comments: Dilation: Fingertip Effacement (%): 60 Cervical Position: Middle Station: -2 Exam by:: Dorrene German RN  Musculoskeletal: Normal range of motion.  Neurological: She is alert and oriented to person, place, and time.  Skin: Skin is warm. She is not diaphoretic.  Psychiatric: Her behavior is normal.   Fetal Tracing: Baseline: 145 bpm  Variability: Moderate  Accelerations: 15x15 Decelerations: None Toco: Irregular pattern   MAU Course  Procedures  None  MDM  UA NST   Assessment and Plan   A:  1. Braxton Hicks  contractions     P:  Discharge home in stable condition Labor precautions  Return to MAU if symptoms worsen   Duane Lope, NP 01/22/2016 7:07 PM

## 2016-01-26 ENCOUNTER — Encounter: Payer: Self-pay | Admitting: Family Medicine

## 2016-01-26 ENCOUNTER — Encounter: Payer: Commercial Managed Care - PPO | Admitting: Advanced Practice Midwife

## 2016-01-27 ENCOUNTER — Ambulatory Visit: Payer: Commercial Managed Care - PPO | Admitting: Medical

## 2016-01-28 ENCOUNTER — Other Ambulatory Visit (HOSPITAL_COMMUNITY)
Admission: RE | Admit: 2016-01-28 | Discharge: 2016-01-28 | Disposition: A | Discharge status: Home / Self Care | Payer: Commercial Managed Care - PPO | Source: Ambulatory Visit | Attending: Advanced Practice Midwife | Admitting: Advanced Practice Midwife

## 2016-01-28 ENCOUNTER — Ambulatory Visit (INDEPENDENT_AMBULATORY_CARE_PROVIDER_SITE_OTHER): Payer: Commercial Managed Care - PPO | Admitting: Advanced Practice Midwife

## 2016-01-28 VITALS — BP 127/69 | HR 94 | Wt 193.7 lb

## 2016-01-28 DIAGNOSIS — Z113 Encounter for screening for infections with a predominantly sexual mode of transmission: Secondary | ICD-10-CM | POA: Diagnosis not present

## 2016-01-28 DIAGNOSIS — Z23 Encounter for immunization: Secondary | ICD-10-CM | POA: Diagnosis not present

## 2016-01-28 DIAGNOSIS — Z3403 Encounter for supervision of normal first pregnancy, third trimester: Secondary | ICD-10-CM

## 2016-01-28 DIAGNOSIS — Z3480 Encounter for supervision of other normal pregnancy, unspecified trimester: Secondary | ICD-10-CM

## 2016-01-28 LAB — OB RESULTS CONSOLE GBS: STREP GROUP B AG: NEGATIVE

## 2016-01-28 LAB — OB RESULTS CONSOLE GC/CHLAMYDIA: Gonorrhea: NEGATIVE

## 2016-01-28 NOTE — Progress Notes (Signed)
   PRENATAL VISIT NOTE  Subjective:  Courtney Mitchell is a 21 y.o. G3P0020 at 4327w5d being sAvel [name] today for ongoing prenatal care.  She is currently monitored for the following issues for this low-risk pregnancy and has Unspecified deficiency anemia; Heart murmur; Supervision of normal pregnancy, antepartum; Trichomonal vaginitis during pregnancy; BMI 31.0-31.9,adult; Obesity in pregnancy; Anemia affecting pregnancy, antepartum; and Headache in pregnancy, antepartum on her problem list.  Patient reports occasional contractions.  Contractions: Irritability. Vag. Bleeding: None.  Movement: Present. Denies leaking of fluid.   The following portions of the patient's history were reviewed and updated as appropriate: allergies, current medications, past family history, past medical history, past social history, past surgical history and problem list. Problem list updated.  Objective:   Vitals:   01/28/16 0758  BP: 127/69  Pulse: 94  Weight: 193 lb 11.2 oz (87.9 kg)    Fetal Status: Fetal Heart Rate (bpm): 163 Fundal Height: 37 cm Movement: Present  Presentation: Vertex  General:  Alert, oriented and cooperative. Patient is in no acute distress.  Skin: Skin is warm and dry. No rash noted.   Cardiovascular: Normal heart rate noted  Respiratory: Normal respiratory effort, no problems with respiration noted  Abdomen: Soft, gravid, appropriate for gestational age. Pain/Pressure: Present     Pelvic:  Cervical exam performed Dilation: Closed Effacement (%): Thick Station: -2  Extremities: Normal range of motion.  Edema: Trace  Mental Status: Normal mood and affect. Normal behavior. Normal judgment and thought content.   Assessment and Plan:  Pregnancy: G3P0020 at 9927w5d  1. Encounter for supervision of normal first pregnancy in third trimester  - Culture, beta strep (group b only) - GC/Chlamydia probe amp ([place]  2. Supervision of other normal pregnancy, antepartum - Waterbirth  consent signed  Term labor symptoms and general obstetric precautions including but not limited to vaginal bleeding, contractions, leaking of fluid and fetal movement were reviewed in detail with the patient. Please refer to After Visit Summary for other counseling recommendations.  Return in 1 week (on 02/04/2016).   Dorathy KinsmanVirginia Dorthie Santini, CNM

## 2016-01-28 NOTE — Addendum Note (Signed)
Addended by: Cheree DittoGRAHAM, Wyatt Galvan A on: 01/28/2016 01:14 PM   Modules accepted: Orders

## 2016-01-29 LAB — GC/CHLAMYDIA PROBE AMP (~~LOC~~) NOT AT ARMC
Chlamydia: NEGATIVE
Neisseria Gonorrhea: NEGATIVE

## 2016-01-30 LAB — CULTURE, BETA STREP (GROUP B ONLY)

## 2016-02-04 ENCOUNTER — Encounter: Payer: Commercial Managed Care - PPO | Admitting: Obstetrics & Gynecology

## 2016-02-04 ENCOUNTER — Ambulatory Visit (INDEPENDENT_AMBULATORY_CARE_PROVIDER_SITE_OTHER): Payer: Commercial Managed Care - PPO | Admitting: Student

## 2016-02-04 VITALS — BP 117/71 | HR 88 | Wt 192.0 lb

## 2016-02-04 DIAGNOSIS — Z3480 Encounter for supervision of other normal pregnancy, unspecified trimester: Secondary | ICD-10-CM

## 2016-02-04 DIAGNOSIS — Z3483 Encounter for supervision of other normal pregnancy, third trimester: Secondary | ICD-10-CM

## 2016-02-04 NOTE — Progress Notes (Signed)
   PRENATAL VISIT NOTE  Subjective:  Courtney Mitchell is a 21 y.o. G3P0020 at 4444w5d being seen today for ongoing prenatal care.  She is currently monitored for the following issues for this low-risk pregnancy and has Unspecified deficiency anemia; Heart murmur; Supervision of normal pregnancy, antepartum; Trichomonal vaginitis during pregnancy; BMI 31.0-31.9,adult; Obesity in pregnancy; Anemia affecting pregnancy, antepartum; and Headache in pregnancy, antepartum on her problem list.  Patient reports occasional contractions.  Contractions: Irritability. Vag. Bleeding: Scant.  Movement: Present. Denies leaking of fluid.   The following portions of the patient's history were reviewed and updated as appropriate: allergies, current medications, past family history, past medical history, past social history, past surgical history and problem list. Problem list updated.  Objective:   Vitals:   02/04/16 0818  BP: 117/71  Pulse: 88  Weight: 192 lb (87.1 kg)    Fetal Status: Fetal Heart Rate (bpm): 155 Fundal Height: 37 cm Movement: Present  Presentation: Vertex  General:  Alert, oriented and cooperative. Patient is in no acute distress.  Skin: Skin is warm and dry. No rash noted.   Cardiovascular: Normal heart rate noted  Respiratory: Normal respiratory effort, no problems with respiration noted  Abdomen: Soft, gravid, appropriate for gestational age. Pain/Pressure: Present     Pelvic:  Cervical exam performed Dilation: 1.5 Effacement (%): 50 Station: -3  Extremities: Normal range of motion.  Edema: Trace  Mental Status: Normal mood and affect. Normal behavior. Normal judgment and thought content.   Assessment and Plan:  Pregnancy: G3P0020 at 10644w5d  1. Supervision of other normal pregnancy, antepartum   Term labor symptoms and general obstetric precautions including but not limited to vaginal bleeding, contractions, leaking of fluid and fetal movement were reviewed in detail with the  patient. Please refer to After Visit Summary for other counseling recommendations.  Return in about 1 week (around 02/11/2016) for Routine OB.   Judeth HornErin Dequon Schnebly, NP

## 2016-02-04 NOTE — Patient Instructions (Signed)
Braxton Hicks Contractions °Contractions of the uterus can occur throughout pregnancy. Contractions are not always a sign that you are in labor.  °WHAT ARE BRAXTON HICKS CONTRACTIONS?  °Contractions that occur before labor are called Braxton Hicks contractions, or false labor. Toward the end of pregnancy (32-34 weeks), these contractions can develop more often and may become more forceful. This is not true labor because these contractions do not result in opening (dilatation) and thinning of the cervix. They are sometimes difficult to tell apart from true labor because these contractions can be forceful and people have different pain tolerances. You should not feel embarrassed if you go to the hospital with false labor. Sometimes, the only way to tell if you are in true labor is for your health care provider to look for changes in the cervix. °If there are no prenatal problems or other health problems associated with the pregnancy, it is completely safe to be sent home with false labor and await the onset of true labor. °HOW CAN YOU TELL THE DIFFERENCE BETWEEN TRUE AND FALSE LABOR? °False Labor  °· The contractions of false labor are usually shorter and not as hard as those of true labor.   °· The contractions are usually irregular.   °· The contractions are often felt in the front of the lower abdomen and in the groin.   °· The contractions may go away when you walk around or change positions while lying down.   °· The contractions get weaker and are shorter lasting as time goes on.   °· The contractions do not usually become progressively stronger, regular, and closer together as with true labor.   °True Labor  °· Contractions in true labor last 30-70 seconds, become very regular, usually become more intense, and increase in frequency.   °· The contractions do not go away with walking.   °· The discomfort is usually felt in the top of the uterus and spreads to the lower abdomen and low back.   °· True labor can be  determined by your health care provider with an exam. This will show that the cervix is dilating and getting thinner.   °WHAT TO REMEMBER °· Keep up with your usual exercises and follow other instructions given by your health care provider.   °· Take medicines as directed by your health care provider.   °· Keep your regular prenatal appointments.   °· Eat and drink lightly if you think you are going into labor.   °· If Braxton Hicks contractions are making you uncomfortable:   °¨ Change your position from lying down or resting to walking, or from walking to resting.   °¨ Sit and rest in a tub of warm water.   °¨ Drink 2-3 glasses of water. Dehydration may cause these contractions.   °¨ Do slow and deep breathing several times an hour.   °WHEN SHOULD I SEEK IMMEDIATE MEDICAL CARE? °Seek immediate medical care if: °· Your contractions become stronger, more regular, and closer together.   °· You have fluid leaking or gushing from your vagina.   °· You have a fever.   °· You pass blood-tinged mucus.   °· You have vaginal bleeding.   °· You have continuous abdominal pain.   °· You have low back pain that you never had before.   °· You feel your baby's head pushing down and causing pelvic pressure.   °· Your baby is not moving as much as it used to.   °This information is not intended to replace advice given to you by your health care provider. Make sure you discuss any questions you have with your health care   provider. °Document Released: 12/21/2004 Document Revised: 04/14/2015 Document Reviewed: 10/02/2012 °Elsevier Interactive Patient Education © 2017 Elsevier Inc. ° °

## 2016-02-09 ENCOUNTER — Ambulatory Visit (INDEPENDENT_AMBULATORY_CARE_PROVIDER_SITE_OTHER): Payer: Commercial Managed Care - PPO | Admitting: Student

## 2016-02-09 DIAGNOSIS — Z3483 Encounter for supervision of other normal pregnancy, third trimester: Secondary | ICD-10-CM

## 2016-02-09 DIAGNOSIS — Z3480 Encounter for supervision of other normal pregnancy, unspecified trimester: Secondary | ICD-10-CM

## 2016-02-09 NOTE — Progress Notes (Signed)
   PRENATAL VISIT NOTE  Subjective:  Courtney Mitchell is a 21 y.o. G3P0020 at 5433w3d being seen today for ongoing prenatal care.  She is currently monitored for the following issues for this low-risk pregnancy and has Unspecified deficiency anemia; Heart murmur; Supervision of normal pregnancy, antepartum; Trichomonal vaginitis during pregnancy; BMI 31.0-31.9,adult; Obesity in pregnancy; Anemia affecting pregnancy, antepartum; and Headache in pregnancy, antepartum on her problem list.  Patient reports no complaints and light bleeding but thinks its her mucous plug.  Contractions: Irritability. Vag. Bleeding: Bloody Show.  Movement: Present. Denies leaking of fluid.   The following portions of the patient's history were reviewed and updated as appropriate: allergies, current medications, past family history, past medical history, past social history, past surgical history and problem list. Problem list updated.  Objective:   Vitals:   02/09/16 1522  BP: 133/73  Pulse: 82  Weight: 190 lb 12.8 oz (86.5 kg)    Fetal Status: Fetal Heart Rate (bpm): 152 Fundal Height: 36 cm Movement: Present  Presentation: Vertex  General:  Alert, oriented and cooperative. Patient is in no acute distress.  Skin: Skin is warm and dry. No rash noted.   Cardiovascular: Normal heart rate noted  Respiratory: Normal respiratory effort, no problems with respiration noted  Abdomen: Soft, gravid, appropriate for gestational age. Pain/Pressure: Present     Pelvic:  Cervical exam performed Dilation: 1.5 Effacement (%): 50 Station: -3  Extremities: Normal range of motion.  Edema: Trace  Mental Status: Normal mood and affect. Normal behavior. Normal judgment and thought content.   Assessment and Plan:  Pregnancy: G3P0020 at 5333w3d  1. Supervision of other normal pregnancy, antepartum   Term labor symptoms and general obstetric precautions including but not limited to vaginal bleeding, contractions, leaking of fluid and  fetal movement were reviewed in detail with the patient. Please refer to After Visit Summary for other counseling recommendations.  Return in about 1 week (around 02/16/2016), or ROB.   Marylene LandKathryn Lorraine Engelbert Sevin, CNM

## 2016-02-09 NOTE — Patient Instructions (Signed)
Braxton Hicks Contractions °Contractions of the uterus can occur throughout pregnancy. Contractions are not always a sign that you are in labor.  °WHAT ARE BRAXTON HICKS CONTRACTIONS?  °Contractions that occur before labor are called Braxton Hicks contractions, or false labor. Toward the end of pregnancy (32-34 weeks), these contractions can develop more often and may become more forceful. This is not true labor because these contractions do not result in opening (dilatation) and thinning of the cervix. They are sometimes difficult to tell apart from true labor because these contractions can be forceful and people have different pain tolerances. You should not feel embarrassed if you go to the hospital with false labor. Sometimes, the only way to tell if you are in true labor is for your health care provider to look for changes in the cervix. °If there are no prenatal problems or other health problems associated with the pregnancy, it is completely safe to be sent home with false labor and await the onset of true labor. °HOW CAN YOU TELL THE DIFFERENCE BETWEEN TRUE AND FALSE LABOR? °False Labor  °· The contractions of false labor are usually shorter and not as hard as those of true labor.   °· The contractions are usually irregular.   °· The contractions are often felt in the front of the lower abdomen and in the groin.   °· The contractions may go away when you walk around or change positions while lying down.   °· The contractions get weaker and are shorter lasting as time goes on.   °· The contractions do not usually become progressively stronger, regular, and closer together as with true labor.   °True Labor  °· Contractions in true labor last 30-70 seconds, become very regular, usually become more intense, and increase in frequency.   °· The contractions do not go away with walking.   °· The discomfort is usually felt in the top of the uterus and spreads to the lower abdomen and low back.   °· True labor can be  determined by your health care provider with an exam. This will show that the cervix is dilating and getting thinner.   °WHAT TO REMEMBER °· Keep up with your usual exercises and follow other instructions given by your health care provider.   °· Take medicines as directed by your health care provider.   °· Keep your regular prenatal appointments.   °· Eat and drink lightly if you think you are going into labor.   °· If Braxton Hicks contractions are making you uncomfortable:   °¨ Change your position from lying down or resting to walking, or from walking to resting.   °¨ Sit and rest in a tub of warm water.   °¨ Drink 2-3 glasses of water. Dehydration may cause these contractions.   °¨ Do slow and deep breathing several times an hour.   °WHEN SHOULD I SEEK IMMEDIATE MEDICAL CARE? °Seek immediate medical care if: °· Your contractions become stronger, more regular, and closer together.   °· You have fluid leaking or gushing from your vagina.   °· You have a fever.   °· You pass blood-tinged mucus.   °· You have vaginal bleeding.   °· You have continuous abdominal pain.   °· You have low back pain that you never had before.   °· You feel your baby's head pushing down and causing pelvic pressure.   °· Your baby is not moving as much as it used to.   °This information is not intended to replace advice given to you by your health care provider. Make sure you discuss any questions you have with your health care   provider. °Document Released: 12/21/2004 Document Revised: 04/14/2015 Document Reviewed: 10/02/2012 °Elsevier Interactive Patient Education © 2017 Elsevier Inc. ° °

## 2016-02-12 ENCOUNTER — Encounter: Payer: Commercial Managed Care - PPO | Admitting: Obstetrics & Gynecology

## 2016-02-16 ENCOUNTER — Inpatient Hospital Stay (HOSPITAL_COMMUNITY)
Admission: AD | Admit: 2016-02-16 | Discharge: 2016-02-19 | DRG: 775 | Disposition: A | Discharge status: Home / Self Care | Payer: Commercial Managed Care - PPO | Source: Ambulatory Visit | Attending: Obstetrics and Gynecology | Admitting: Obstetrics and Gynecology

## 2016-02-16 DIAGNOSIS — O9081 Anemia of the puerperium: Secondary | ICD-10-CM | POA: Diagnosis not present

## 2016-02-16 DIAGNOSIS — Z3A39 39 weeks gestation of pregnancy: Secondary | ICD-10-CM

## 2016-02-16 DIAGNOSIS — Z8249 Family history of ischemic heart disease and other diseases of the circulatory system: Secondary | ICD-10-CM

## 2016-02-16 DIAGNOSIS — D649 Anemia, unspecified: Secondary | ICD-10-CM | POA: Diagnosis not present

## 2016-02-16 DIAGNOSIS — Z3493 Encounter for supervision of normal pregnancy, unspecified, third trimester: Secondary | ICD-10-CM | POA: Diagnosis present

## 2016-02-16 MED ORDER — SOD CITRATE-CITRIC ACID 500-334 MG/5ML PO SOLN: 30.0000 mL | ORAL | Status: DC | PRN

## 2016-02-16 MED ORDER — FENTANYL CITRATE (PF) 100 MCG/2ML IJ SOLN
100.0000 ug | Freq: Once | INTRAMUSCULAR | Status: AC
  Administered 2016-02-16: 100 ug via INTRAVENOUS
  Filled 2016-02-16: qty 2

## 2016-02-16 MED ORDER — ONDANSETRON HCL 4 MG/2ML IJ SOLN
4.0000 mg | Freq: Four times a day (QID) | INTRAMUSCULAR | Status: DC | PRN
  Administered 2016-02-17 (×2): 4 mg via INTRAVENOUS
  Filled 2016-02-16 (×3): qty 2

## 2016-02-16 MED ORDER — LACTATED RINGERS IV BOLUS (SEPSIS)
1000.0000 mL | Freq: Once | INTRAVENOUS | Status: AC
  Administered 2016-02-16: 1000 mL via INTRAVENOUS

## 2016-02-16 MED ORDER — ACETAMINOPHEN 325 MG PO TABS
650.0000 mg | ORAL_TABLET | ORAL | Status: DC | PRN
Start: 2016-02-16 — End: 2016-02-17
  Administered 2016-02-17: 650 mg via ORAL
  Filled 2016-02-16: qty 2

## 2016-02-16 MED ORDER — LACTATED RINGERS IV SOLN
INTRAVENOUS | Status: DC
  Administered 2016-02-16 – 2016-02-17 (×3): via INTRAVENOUS

## 2016-02-16 MED ORDER — OXYTOCIN BOLUS FROM INFUSION
500.0000 mL | Freq: Once | INTRAVENOUS | Status: AC
  Administered 2016-02-17: 500 mL via INTRAVENOUS

## 2016-02-16 MED ORDER — OXYTOCIN 40 UNITS IN LACTATED RINGERS INFUSION - SIMPLE MED: 2.5000 [IU]/h | INTRAVENOUS | Status: DC

## 2016-02-16 MED ORDER — LIDOCAINE HCL (PF) 1 % IJ SOLN
30.0000 mL | INTRAMUSCULAR | Status: DC | PRN
  Filled 2016-02-16: qty 30

## 2016-02-16 MED ORDER — OXYCODONE-ACETAMINOPHEN 5-325 MG PO TABS: 1.0000 | ORAL_TABLET | ORAL | Status: DC | PRN

## 2016-02-16 MED ORDER — OXYCODONE-ACETAMINOPHEN 5-325 MG PO TABS: 2.0000 | ORAL_TABLET | ORAL | Status: DC | PRN

## 2016-02-16 MED ORDER — FLEET ENEMA 7-19 GM/118ML RE ENEM: 1.0000 | ENEMA | RECTAL | Status: DC | PRN

## 2016-02-16 MED ORDER — LACTATED RINGERS IV SOLN: 500.0000 mL | INTRAVENOUS | Status: DC | PRN

## 2016-02-16 NOTE — Progress Notes (Addendum)
G1 @ 39+[redacted] wksga. Presents to triage for contractions and getting stronger for past 2-3 hrs. Crying from ctx. Denies LOF or bleeding. +FM.  SVE 3/50/-3. EFM applied.   IV started. Labs drawn. LR bolus.   2127: SVE 4/60/-2 BB  2222: Fentanyl 100mcg IVPush given per order.   2300: checked on pt. writhing in pain with ctx. Provider notified.

## 2016-02-17 ENCOUNTER — Encounter (HOSPITAL_COMMUNITY): Payer: Self-pay | Admitting: *Deleted

## 2016-02-17 ENCOUNTER — Inpatient Hospital Stay (HOSPITAL_COMMUNITY): Payer: Commercial Managed Care - PPO | Admitting: Anesthesiology

## 2016-02-17 DIAGNOSIS — Z3A39 39 weeks gestation of pregnancy: Secondary | ICD-10-CM

## 2016-02-17 LAB — TYPE AND SCREEN
ABO/RH(D): O POS
ANTIBODY SCREEN: NEGATIVE

## 2016-02-17 LAB — CBC
HEMATOCRIT: 31.8 % — AB (ref 36.0–46.0)
Hemoglobin: 10.3 g/dL — ABNORMAL LOW (ref 12.0–15.0)
MCH: 24.2 pg — ABNORMAL LOW (ref 26.0–34.0)
MCHC: 32.4 g/dL (ref 30.0–36.0)
MCV: 74.6 fL — ABNORMAL LOW (ref 78.0–100.0)
PLATELETS: 273 10*3/uL (ref 150–400)
RBC: 4.26 MIL/uL (ref 3.87–5.11)
RDW: 15.3 % (ref 11.5–15.5)
WBC: 12.2 10*3/uL — AB (ref 4.0–10.5)

## 2016-02-17 MED ORDER — FENTANYL 2.5 MCG/ML BUPIVACAINE 1/10 % EPIDURAL INFUSION (WH - ANES): 14.0000 mL/h | INTRAMUSCULAR | Status: DC | PRN

## 2016-02-17 MED ORDER — TETANUS-DIPHTH-ACELL PERTUSSIS 5-2.5-18.5 LF-MCG/0.5 IM SUSP: 0.5000 mL | Freq: Once | INTRAMUSCULAR | Status: DC

## 2016-02-17 MED ORDER — EPHEDRINE 5 MG/ML INJ
10.0000 mg | INTRAVENOUS | Status: DC | PRN
  Filled 2016-02-17: qty 4

## 2016-02-17 MED ORDER — PRENATAL MULTIVITAMIN CH
1.0000 | ORAL_TABLET | Freq: Every day | ORAL | Status: DC
  Administered 2016-02-18 – 2016-02-19 (×2): 1 via ORAL
  Filled 2016-02-17 (×2): qty 1

## 2016-02-17 MED ORDER — DIPHENHYDRAMINE HCL 50 MG/ML IJ SOLN: 12.5000 mg | INTRAMUSCULAR | Status: DC | PRN

## 2016-02-17 MED ORDER — OXYTOCIN 40 UNITS IN LACTATED RINGERS INFUSION - SIMPLE MED
1.0000 m[IU]/min | INTRAVENOUS | Status: DC
  Administered 2016-02-17: 1.333 m[IU]/min via INTRAVENOUS
  Filled 2016-02-17: qty 1000

## 2016-02-17 MED ORDER — METHYLERGONOVINE MALEATE 0.2 MG/ML IJ SOLN
INTRAMUSCULAR | Status: AC
  Filled 2016-02-17: qty 1

## 2016-02-17 MED ORDER — PROMETHAZINE HCL 25 MG/ML IJ SOLN
12.5000 mg | Freq: Once | INTRAMUSCULAR | Status: AC
  Administered 2016-02-17: 12.5 mg via INTRAVENOUS
  Filled 2016-02-17: qty 1

## 2016-02-17 MED ORDER — FENTANYL CITRATE (PF) 100 MCG/2ML IJ SOLN
INTRAMUSCULAR | Status: AC
  Filled 2016-02-17: qty 2

## 2016-02-17 MED ORDER — METHYLERGONOVINE MALEATE 0.2 MG/ML IJ SOLN: 0.2000 mg | INTRAMUSCULAR | Status: DC | PRN

## 2016-02-17 MED ORDER — COCONUT OIL OIL: 1.0000 "application " | TOPICAL_OIL | Status: DC | PRN

## 2016-02-17 MED ORDER — METHYLERGONOVINE MALEATE 0.2 MG PO TABS: 0.2000 mg | ORAL_TABLET | ORAL | Status: DC | PRN

## 2016-02-17 MED ORDER — METHYLERGONOVINE MALEATE 0.2 MG/ML IJ SOLN
0.2000 mg | Freq: Once | INTRAMUSCULAR | Status: AC
  Administered 2016-02-17: 0.2 mg via INTRAMUSCULAR

## 2016-02-17 MED ORDER — SIMETHICONE 80 MG PO CHEW
80.0000 mg | CHEWABLE_TABLET | ORAL | Status: DC | PRN
  Administered 2016-02-17 – 2016-02-19 (×2): 80 mg via ORAL
  Filled 2016-02-17 (×2): qty 1

## 2016-02-17 MED ORDER — SODIUM CHLORIDE 0.9 % IV SOLN: INTRAVENOUS | Status: DC

## 2016-02-17 MED ORDER — WITCH HAZEL-GLYCERIN EX PADS: 1.0000 "application " | MEDICATED_PAD | CUTANEOUS | Status: DC | PRN

## 2016-02-17 MED ORDER — SODIUM CHLORIDE 0.9 % IV SOLN
250.0000 mL | INTRAVENOUS | Status: DC | PRN
  Administered 2016-02-17: 250 mL via INTRAVENOUS

## 2016-02-17 MED ORDER — SODIUM CHLORIDE 0.9% FLUSH
3.0000 mL | Freq: Two times a day (BID) | INTRAVENOUS | Status: DC
  Administered 2016-02-17 – 2016-02-18 (×2): 3 mL via INTRAVENOUS

## 2016-02-17 MED ORDER — FENTANYL CITRATE (PF) 100 MCG/2ML IJ SOLN: 100.0000 ug | INTRAMUSCULAR | Status: DC | PRN

## 2016-02-17 MED ORDER — FAMOTIDINE IN NACL 20-0.9 MG/50ML-% IV SOLN
20.0000 mg | Freq: Once | INTRAVENOUS | Status: AC
Start: 2016-02-17 — End: 2016-02-17
  Administered 2016-02-17: 20 mg via INTRAVENOUS
  Filled 2016-02-17: qty 50

## 2016-02-17 MED ORDER — SENNOSIDES-DOCUSATE SODIUM 8.6-50 MG PO TABS
2.0000 | ORAL_TABLET | ORAL | Status: DC
  Administered 2016-02-18: 2 via ORAL
  Filled 2016-02-17 (×2): qty 2

## 2016-02-17 MED ORDER — PHENYLEPHRINE 40 MCG/ML (10ML) SYRINGE FOR IV PUSH (FOR BLOOD PRESSURE SUPPORT)
80.0000 ug | PREFILLED_SYRINGE | INTRAVENOUS | Status: DC | PRN
  Filled 2016-02-17: qty 5
  Filled 2016-02-17: qty 10

## 2016-02-17 MED ORDER — FENTANYL 2.5 MCG/ML BUPIVACAINE 1/10 % EPIDURAL INFUSION (WH - ANES)
14.0000 mL/h | INTRAMUSCULAR | Status: DC | PRN
  Administered 2016-02-17 (×2): 14 mL/h via EPIDURAL
  Filled 2016-02-17 (×2): qty 100

## 2016-02-17 MED ORDER — SODIUM CHLORIDE 0.9% FLUSH
3.0000 mL | INTRAVENOUS | Status: DC | PRN
  Administered 2016-02-18: 3 mL via INTRAVENOUS
  Filled 2016-02-17 (×2): qty 3

## 2016-02-17 MED ORDER — LACTATED RINGERS IV SOLN: 500.0000 mL | Freq: Once | INTRAVENOUS | Status: DC

## 2016-02-17 MED ORDER — ZOLPIDEM TARTRATE 5 MG PO TABS: 5.0000 mg | ORAL_TABLET | Freq: Every evening | ORAL | Status: DC | PRN

## 2016-02-17 MED ORDER — LIDOCAINE HCL (PF) 1 % IJ SOLN
INTRAMUSCULAR | Status: DC | PRN
  Administered 2016-02-17 (×2): 4 mL

## 2016-02-17 MED ORDER — OXYTOCIN 40 UNITS IN LACTATED RINGERS INFUSION - SIMPLE MED: 2.5000 [IU]/h | INTRAVENOUS | Status: DC | PRN

## 2016-02-17 MED ORDER — DIBUCAINE 1 % RE OINT: 1.0000 "application " | TOPICAL_OINTMENT | RECTAL | Status: DC | PRN

## 2016-02-17 MED ORDER — PHENYLEPHRINE 40 MCG/ML (10ML) SYRINGE FOR IV PUSH (FOR BLOOD PRESSURE SUPPORT)
80.0000 ug | PREFILLED_SYRINGE | INTRAVENOUS | Status: DC | PRN
  Administered 2016-02-17: 80 ug via INTRAVENOUS
  Filled 2016-02-17: qty 5

## 2016-02-17 MED ORDER — ONDANSETRON HCL 4 MG/2ML IJ SOLN
4.0000 mg | INTRAMUSCULAR | Status: DC | PRN
  Filled 2016-02-17 (×3): qty 2

## 2016-02-17 MED ORDER — SODIUM CHLORIDE 0.9 % IV SOLN
8.0000 mg | Freq: Once | INTRAVENOUS | Status: AC
  Administered 2016-02-17: 8 mg via INTRAVENOUS
  Filled 2016-02-17: qty 4

## 2016-02-17 MED ORDER — ACETAMINOPHEN 325 MG PO TABS
650.0000 mg | ORAL_TABLET | ORAL | Status: DC | PRN
  Administered 2016-02-17 – 2016-02-18 (×3): 650 mg via ORAL
  Filled 2016-02-17 (×3): qty 2

## 2016-02-17 MED ORDER — DIPHENHYDRAMINE HCL 25 MG PO CAPS: 25.0000 mg | ORAL_CAPSULE | Freq: Four times a day (QID) | ORAL | Status: DC | PRN

## 2016-02-17 MED ORDER — BENZOCAINE-MENTHOL 20-0.5 % EX AERO
1.0000 "application " | INHALATION_SPRAY | CUTANEOUS | Status: DC | PRN
  Filled 2016-02-17: qty 56

## 2016-02-17 MED ORDER — ONDANSETRON HCL 4 MG PO TABS: 4.0000 mg | ORAL_TABLET | ORAL | Status: DC | PRN

## 2016-02-17 MED ORDER — IBUPROFEN 600 MG PO TABS
600.0000 mg | ORAL_TABLET | Freq: Four times a day (QID) | ORAL | Status: DC
  Administered 2016-02-17 – 2016-02-19 (×8): 600 mg via ORAL
  Filled 2016-02-17 (×8): qty 1

## 2016-02-17 MED ORDER — TERBUTALINE SULFATE 1 MG/ML IJ SOLN
0.2500 mg | Freq: Once | INTRAMUSCULAR | Status: DC | PRN
  Filled 2016-02-17 (×2): qty 1

## 2016-02-17 NOTE — Anesthesia Procedure Notes (Signed)
Epidural Patient location during procedure: OB  Staffing Anesthesiologist: Lewie LoronGERMEROTH, Cylee Dattilo Performed: anesthesiologist   Preanesthetic Checklist Completed: patient identified, pre-op evaluation, timeout performed, IV checked, risks and benefits discussed and monitors and equipment checked  Epidural Patient position: sitting Prep: site prepped and draped and DuraPrep Patient monitoring: heart rate, blood pressure and continuous pulse ox Approach: midline Location: L2-L3 Injection technique: LOR air and LOR saline  Needle:  Needle type: Tuohy  Needle gauge: 17 G Needle length: 9 cm Needle insertion depth: 7 cm Catheter type: closed end flexible Catheter size: 19 Gauge Catheter at skin depth: 13 cm Test dose: negative  Assessment Sensory level: T8 Events: blood not aspirated, injection not painful, no injection resistance, negative IV test and no paresthesia  Additional Notes Reason for block:procedure for pain

## 2016-02-17 NOTE — Progress Notes (Signed)
Pt complains of nausea and cramping after eating food and receiving x1 dose of phenergan. Dr. Eustace PenMaumaw informed and orders received.

## 2016-02-17 NOTE — H&P (Signed)
LABOR AND DELIVERY ADMISSION HISTORY AND PHYSICAL NOTE  Courtney Mitchell is a 21 y.o. female G3P0020 with IUP at [redacted]w[redacted]d by [place] presenting for SOL. Patient reports feeling contractions that started yesterday.  She denies any HA, changes in vision, CP, SOB, NVD or LE edema.  She reports positive fetal movement. She denies leakage of fluid or vaginal bleeding.                 Clinic  [facility] Prenatal Labs  Dating  [facility]  2/16 (5wk u/s ) Blood type: O/Positive/-- (08/14 1115)   Genetic Screen 1 Screen: Nml NT           Quad: negative Antibody:Negative (08/14 1115)  Anatomic [place]  Normal but incomplete, ant placenta >complete (CCOB) Rubella: 3.59 (08/14 1115)  GTT Early:  WNL            Third trimester: WNL (CCOB) RPR: Non Reactive (08/14 1115)   Flu vaccine  refused HBsAg: Negative (08/14 1115)   TDaP vaccine 01/18/16                                Rhogam: n/a HIV: Non Reactive (08/14 1115)   Baby Food  breast                       GBS:  Neg (For PCN allergy, check sensitivities)  Contraception  Pill  Pap: 2017 neg  Circumcision  yes   Pediatrician  not yet   Support Person  Willie    Negative: [place]  Prenatal History/Complications:  Past Medical History: Past Medical History:  Diagnosis Date  . Anemia   . Heart murmur 03/01/2015  . Menorrhagia 08/03/2011  . Trichomoniasis     Past Surgical History: Past Surgical History:  Procedure Laterality Date  . DILATION AND EVACUATION N/A 03/03/2015   Procedure: DILATATION AND EVACUATION;  Surgeon: Silverio Lay, MD;  Location: [facility];  Service: Gynecology;  Laterality: N/A;  . DILATION AND EVACUATION N/A 04/12/2015   Procedure: DILATATION AND EVACUATION;  Surgeon: Osborn Coho, MD;  Location: [facility];  Service: Gynecology;  Laterality: N/A;  . WISDOM TOOTH EXTRACTION      Obstetrical History: OB History    Gravida Para Term Preterm AB Living   3 0 0 0 2 0   SAB TAB Ectopic Multiple Live Births   2 0 0 0        Social History: Social History    Social History  . Marital status: Single    Spouse name: N/A  . Number of children: N/A  . Years of education: N/A   Social History Main Topics  . Smoking status: Never Smoker  . Smokeless tobacco: Never Used  . Alcohol use No  . Drug use: No  . Sexual activity: Yes    Birth control/ protection: None     Comment: Nexplanon 04/2010   Other Topics Concern  . Not on file   Social History Narrative  . No narrative on file    Family History: Family History  Problem Relation Age of Onset  . Cancer Other   . Asthma Father   . Hypertension Father     Allergies: No Known Allergies  Prescriptions Prior to Admission  Medication Sig Dispense Refill Last Dose  . prenatal vitamin w/FE, FA (NATACHEW) 29-1 MG CHEW chewable tablet Chew 3 tablets by mouth daily at 12 noon.   Taking  Review of Systems   All systems reviewed and negative except as stated in HPI  Blood pressure (!) 146/82, pulse (!) 111, temperature 98.5 F (36.9 C), temperature source Oral, resp. rate 20, height 5\' 5"  (1.651 m), weight 86.2 kg (190 lb), last menstrual period 05/06/2015, unknown if currently breastfeeding. General appearance: alert and cooperative Lungs: clear to auscultation bilaterally Heart: regular rate and rhythm Abdomen: soft, non-tender; bowel sounds normal Extremities: No calf swelling or tenderness Presentation: cephalic Fetal monitoring: HR baseline 135, mod variability, pos accels, variable decels, contractions q2-734min.  Uterine activity:  Dilation: 4 Effacement (%): 90 Station: -2 Exam by:: Elie ConferK. Weiss RN   Prenatal labs: ABO, Rh: O/Positive/-- (08/14 1115) Antibody: Negative (08/14 1115) Rubella: immune RPR: Non Reactive (08/14 1115)  HBsAg: Negative (08/14 1115)  HIV: Non Reactive (08/14 1115)  GBS: Negative (01/24 0000)  1 hr Glucola: normal Genetic screening:  negative Anatomy US: normal  Prenatal Transfer Tool  Maternal Diabetes: No Genetic Screening:  Normal Maternal Ultrasounds/Referrals: Normal Fetal Ultrasounds or other Referrals:  None Maternal Substance Abuse:  No Significant Maternal Medications:  None Significant Maternal Lab Results: None  Results for orders placed or performed during the hospital encounter of 02/16/16 (from the past 24 hour(s))  CBC   Collection Time: 02/16/16  9:45 PM  Result Value Ref Range   WBC 12.2 (H) 4.0 - 10.5 K/uL   RBC 4.26 3.87 - 5.11 MIL/uL   Hemoglobin 10.3 (L) 12.0 - 15.0 g/dL   HCT 96.031.8 (L) 45.436.0 - 09.846.0 %   MCV 74.6 (L) 78.0 - 100.0 fL   MCH 24.2 (L) 26.0 - 34.0 pg   MCHC 32.4 30.0 - 36.0 g/dL   RDW 11.915.3 14.711.5 - 82.915.5 %   Platelets 273 150 - 400 K/uL    Patient Active Problem List   Diagnosis Date Noted  . Headache in pregnancy, antepartum 09/15/2015  . Anemia affecting pregnancy, antepartum 08/18/2015  . Supervision of normal pregnancy, antepartum 07/24/2015  . Trichomonal vaginitis during pregnancy 07/24/2015  . BMI 31.0-31.9,adult 07/24/2015  . Obesity in pregnancy 07/24/2015  . Heart murmur 03/01/2015  . Unspecified deficiency anemia 08/03/2011    Assessment: Courtney Mitchell is a 21 y.o. G3P0020 at 674w4d here for SOL. Planning water birth. Denies loss of fluids or vaginal bleeding.   #Labor: SOL, planning water birth, anticipate SVD.  #Pain: NO and IV pain medication #FWB:  Cat 1 #ID: GBS neg #MOF: breast #MOC: Pills #Circ:  Yes   Renne Muscaaniel L Warden, MD PGY-1 02/17/2016, 12:55 AM  Midwife attestation: I have seen and examined this patient; I agree with above documentation in the resident's note.   Courtney Mitchell is a 21 y.o. G3P0020 here for SOL  PE: BP 108/76   Pulse 96   Temp (!) 96.9 F (36.1 C) (Axillary)   Resp 16   Ht 5\' 5"  (1.651 m)   Wt 190 lb (86.2 kg)   LMP 05/06/2015   SpO2 100%   BMI 31.62 kg/m  Gen: calm comfortable, NAD Resp: normal effort, no distress Abd: gravid  ROS, labs, PMH reviewed  Plan: Admit to LD Labor: Expectant management FWB:  Category 1 ID: GBS neg  Sharen CounterLisa Leftwich-Kirby, CNM  02/17/2016, 7:43 AM

## 2016-02-17 NOTE — Anesthesia Pain Management Evaluation Note (Signed)
  CRNA Pain Management Visit Note  Patient: Courtney Mitchell, 21 y.o., female  "Hello [facility]. We have an anesthesia team available at all times to provide care throughout the hospital, including epidural management and anesthesia for C-section. I don't know your plan for the delivery whether it a natural birth, water birth, IV sedation, nitrous supplementation, doula or epidural, but we want to meet your pain goals."   1.Was your pain managed to your expectations on prior hospitalizations?   No prior hospitalizations  2.What is your expectation for pain management during this hospitalization?     Epidural  3.How can we help you reach that goal? Epidural in situ.  Record the patient's initial score and the patient's pain goal.   Pain: 0  Pain Goal: 3 [facility] wants you to be able to say your pain was always managed very well.  Aubreigh Fuerte L 02/17/2016

## 2016-02-17 NOTE — Anesthesia Preprocedure Evaluation (Signed)
Anesthesia Evaluation  Patient identified by MRN, date of birth, ID band Patient awake    Reviewed: Allergy & Precautions, NPO status , Patient's Chart, lab work & pertinent test results  Airway Mallampati: I  TM Distance: >3 FB Neck ROM: Full    Dental   Pulmonary neg pulmonary ROS,    Pulmonary exam normal        Cardiovascular negative cardio ROS   Rhythm:Regular Rate:Normal     Neuro/Psych  Headaches, negative neurological ROS     GI/Hepatic negative GI ROS, Neg liver ROS,   Endo/Other  negative endocrine ROS  Renal/GU negative Renal ROS     Musculoskeletal   Abdominal   Peds  Hematology  (+) anemia ,   Anesthesia Other Findings   Reproductive/Obstetrics (+) Pregnancy                             Lab Results  Component Value Date   WBC 12.2 (H) 02/16/2016   HGB 10.3 (L) 02/16/2016   HCT 31.8 (L) 02/16/2016   MCV 74.6 (L) 02/16/2016   PLT 273 02/16/2016   Lab Results  Component Value Date   CREATININE 0.64 11/10/2015   BUN 8 11/10/2015   NA 136 11/10/2015   K 3.9 11/10/2015   CL 105 11/10/2015   CO2 24 11/10/2015    Anesthesia Physical  Anesthesia Plan  ASA: II  Anesthesia Plan: Epidural   Post-op Pain Management:    Induction:   Airway Management Planned:   Additional Equipment:   Intra-op Plan:   Post-operative Plan:   Informed Consent: I have reviewed the patients History and Physical, chart, labs and discussed the procedure including the risks, benefits and alternatives for the proposed anesthesia with the patient or authorized representative who has indicated his/her understanding and acceptance.     Plan Discussed with:   Anesthesia Plan Comments:         Anesthesia Quick Evaluation

## 2016-02-17 NOTE — Anesthesia Postprocedure Evaluation (Signed)
Anesthesia Post Note  Patient: Courtney Mitchell  Procedure(s) Performed: * No procedures listed *  Patient location during evaluation: Mother Baby Anesthesia Type: Epidural Level of consciousness: awake and alert and oriented Pain management: satisfactory to patient Vital Signs Assessment: post-procedure vital signs reviewed and stable Respiratory status: spontaneous breathing and nonlabored ventilation Cardiovascular status: stable Postop Assessment: no headache, no backache, no signs of nausea or vomiting, adequate PO intake and patient able to bend at knees (patient up walking) Anesthetic complications: no        Last Vitals:  Vitals:   02/17/16 1443 02/17/16 1546  BP: 140/77 123/67  Pulse: 79 70  Resp: 16 16  Temp: 36.9 C 37.2 C    Last Pain:  Vitals:   02/17/16 1735  TempSrc:   PainSc: 6    Pain Goal: Patients Stated Pain Goal: 0 (02/17/16 1735)               Madison HickmanGREGORY,Anivea Velasques

## 2016-02-17 NOTE — Lactation Note (Signed)
This note was copied from a baby's chart. Lactation Consultation Note Initial visit at 5 hours of age.  Mom holding baby STS at right breast with baby latched now.  RN reports mom had some soreness and RN tugged at chin to widen gape and mom reports improved comfort.  LC observed baby with wide gape and closely latched to moms breast.  Baby had intermittent sucking.  LC instructed mom on breast compressions during feedings, this encouraged baby to be more rhythmic with feeding.  LC discussed non nutritive sucking and how to know when baby is feeding well.   Baby pulled off with nipple round and everted.  Baby remains on moms chest.  LC assisted with hand expression and several drops were easily expressed.  LC reviewed with mom how to compress breast for latching and encouraged mom to work on cross cradle to latch baby and hold breast during feeding.   [facility] resources given and discussed.  Encouraged to feed with early cues on demand.  Early newborn behavior discussed. LC instructed mom on use of spoon with EBM and benefits as needed.   Mom to call for assist as needed.     Patient Name: Courtney Mitchell ZOXWR'UToday's Date: 02/17/2016 Reason for consult: Initial assessment   Maternal Data Formula Feeding for Exclusion: No Has patient been taught Hand Expression?: Yes Does the patient have breastfeeding experience prior to this delivery?: No  Feeding Feeding Type: Breast Fed Length of feed: 40 min  LATCH Score/Interventions Latch: Grasps breast easily, tongue down, lips flanged, rhythmical sucking.  Audible Swallowing: A few with stimulation Intervention(s): Hand expression;Alternate breast massage  Type of Nipple: Everted at rest and after stimulation  Comfort (Breast/Nipple): Soft / non-tender     Hold (Positioning): No assistance needed to correctly position infant at breast.  LATCH Score: 9  Lactation Tools Discussed/Used WIC Program: Yes   Consult Status Consult Status:  Follow-up Date: 02/18/16 Follow-up type: In-patient    [name], [name] 02/17/2016, 4:57 PM

## 2016-02-18 ENCOUNTER — Encounter: Payer: Commercial Managed Care - PPO | Admitting: Obstetrics & Gynecology

## 2016-02-18 LAB — RPR: RPR Ser Ql: NONREACTIVE

## 2016-02-18 MED ORDER — OXYCODONE HCL 5 MG PO TABS
5.0000 mg | ORAL_TABLET | ORAL | Status: DC | PRN
  Administered 2016-02-18 – 2016-02-19 (×3): 5 mg via ORAL
  Filled 2016-02-18 (×3): qty 1

## 2016-02-18 NOTE — Lactation Note (Signed)
This note was copied from a baby's chart. Lactation Consultation Note  Patient Name: Courtney Avel PeaceBrielle Giraldo ONGEX'BToday's Date: 02/18/2016 Reason for consult: Follow-up assessment   Follow up with mom of 29 hour old infant. Infant latched to left breast and actively feeding. He pulled off a few times and mom was able to relatch infant independently. Enc mom to massage/compress breast with feeding. Mom denies nipple pain or tenderness. Mom reports her breast feel a little different today. Mom asked about awakening techniques, reviewed with parents.   Infant with 6 BF for 15-60 minutes, 2 attempts, 5 voids and 4 stool in last 24 hours. LATCH Scores 9. Infant weight 6 lb 9.1 oz with weight loss of 2 % since birth. Mom without questions/concerns at this time.    Maternal Data Formula Feeding for Exclusion: No Has patient been taught Hand Expression?: Yes  Feeding Feeding Type: Breast Fed Length of feed: 20 min  LATCH Score/Interventions Latch: Repeated attempts needed to sustain latch, nipple held in mouth throughout feeding, stimulation needed to elicit sucking reflex. Intervention(s): Adjust position;Assist with latch;Breast massage;Breast compression  Audible Swallowing: Spontaneous and intermittent  Type of Nipple: Everted at rest and after stimulation  Comfort (Breast/Nipple): Soft / non-tender     Hold (Positioning): No assistance needed to correctly position infant at breast.  LATCH Score: 9  Lactation Tools Discussed/Used     Consult Status Consult Status: Follow-up Date: 02/19/16 Follow-up type: In-patient    Courtney Mitchell 02/18/2016, 5:17 PM

## 2016-02-18 NOTE — Progress Notes (Signed)
POSTPARTUM PROGRESS NOTE  Post Partum Day 1 Subjective:  Courtney Mitchell is a 21 y.o. [id number] 1934w4d s/p SVD.  No acute events overnight.  Pt denies problems with ambulating, voiding or po intake.  She denies nausea or vomiting.  Pain is well controlled.  She has had flatus. She has not had bowel movement.  Lochia Minimal.   Objective: Blood pressure 120/60, pulse 71, temperature 98.2 F (36.8 C), temperature source Oral, resp. rate 18, height 5\' 5"  (1.651 m), weight 86.2 kg (190 lb), last menstrual period 05/06/2015, SpO2 99 %, unknown if currently breastfeeding.  Physical Exam:  General: alert, cooperative and no distress Lochia:normal flow Chest: CTAB Heart: RRR no m/r/g Abdomen: +BS, soft, nontender,  Uterine Fundus: firm DVT Evaluation: No calf swelling or tenderness Extremities: no edema   Recent Labs  02/16/16 2145  HGB 10.3*  HCT 31.8*    Assessment/Plan:  ASSESSMENT: Courtney Mitchell is a 21 y.o. [id number] 9334w4d s/p SVD. Doing well with no concerns. Baby to get circumcision today.  Mom is unsure if she wants to go home today.   Plan for discharge tomorrow   LOS: 2 days   Renne Muscaaniel L Warden, MD PGY-1 [facility], [facility]  02/18/2016, 9:10 AM   OB FELLOW POSTPARTUM PROGRESS NOTE ATTESTATION  I have seen and examined this patient and agree with above documentation in the resident's note.   Ernestina PennaNicholas Schenk, MD 7:01 PM

## 2016-02-19 MED ORDER — IBUPROFEN 600 MG PO TABS: 600.0000 mg | ORAL_TABLET | Freq: Four times a day (QID) | ORAL | 0 refills | Status: DC | PRN

## 2016-02-19 NOTE — Discharge Instructions (Signed)

## 2016-02-19 NOTE — Progress Notes (Signed)
POSTPARTUM PROGRESS NOTE  Post Partum Day 2 Subjective:  Courtney Mitchell is a 21 y.o. [id number] [redacted]w[redacted]d s/p SVD.  No acute events overnight.  Pt denies problems with ambulating, voiding or po intake.  She denies nausea or vomiting.  Pain is well controlled.  She has had flatus. She has not had bowel movement.  Lochia Small.   Objective: Blood pressure 120/72, pulse 73, temperature 98.4 F (36.9 C), temperature source Oral, resp. rate 18, height 5\' 5"  (1.651 m), weight 86.2 kg (190 lb), last menstrual period 05/06/2015, SpO2 99 %, unknown if currently breastfeeding.  Physical Exam:  General: alert, cooperative and no distress Lochia:normal flow Chest: CTAB; normal work of breathing Heart: RRR no m/r/g; normal s1 & s2 Abdomen: +BS, soft, nontender,  Uterine Fundus: firm, DVT Evaluation: No calf swelling or tenderness Extremities: No edema; +2 dorsalis pedis pulses   Recent Labs  02/16/16 2145  HGB 10.3*  HCT 31.8*    Assessment/Plan:  ASSESSMENT: Courtney PeaceBrielle Mckelvie is a 21 y.o. [id number] 555w4d s/p SVD. Doing well with no concerns. Circumcision completed yesterday. Mom reports interest in going home today  Discharge home, Breastfeeding, Circumcision prior to discharge and Contraception progestin pills   LOS: 3 days   Collie Siadgar Ogar, Medical Student PGY-1 [facility], [facility]  02/19/2016, 8:15 AM   I have seen and examined this patient and I agree with the above. See my Discharge Summary. Cam HaiSHAW, Zimir Kittleson CNM 9:25 AM 02/19/2016

## 2016-02-19 NOTE — Lactation Note (Addendum)
This note was copied from a baby's chart. Lactation Consultation Note: Mom has baby latched to breast when I went into room. Reports he is getting sleepy. Encouraged to wrap him and stimulation to wake him up. Latched easily by herself with no assist from me. Nursed for 10 more min. Nipple round when he came off the breast. Encouragement given.  Reports when she hand expresses milk looks a little different then yesterday. Reviewed engorgement prevention and treatment. Is getting DEBP from insurance company. Manual pump given with instructions for setup, use and cleaning of pump pieces. # 27 flange given in case of swelling when milk increases.  No questions at present. Reviewed our pone number, OP appointments and BFSG as resources for support after DC. To call prn  Patient Name: Boy Avel PeaceBrielle Cordone ZOXWR'UToday's Date: 02/19/2016 Reason for consult: Follow-up assessment   Maternal Data Formula Feeding for Exclusion: No Has patient been taught Hand Expression?: Yes Does the patient have breastfeeding experience prior to this delivery?: No  Feeding Feeding Type: Breast Fed Length of feed: 20 min  LATCH Score/Interventions Latch: Grasps breast easily, tongue down, lips flanged, rhythmical sucking.  Audible Swallowing: A few with stimulation  Type of Nipple: Everted at rest and after stimulation  Comfort (Breast/Nipple): Soft / non-tender     Hold (Positioning): No assistance needed to correctly position infant at breast.  LATCH Score: 9  Lactation Tools Discussed/Used [facility] No Pump Review: Setup, frequency, and cleaning Initiated by:: DW Date initiated:: 02/19/16   Consult Status Consult Status: Complete    Pamelia HoitWeeks, Breleigh Carpino D 02/19/2016, 9:36 AM

## 2016-02-19 NOTE — Discharge Summary (Signed)
OB Discharge Summary     Patient Name: Courtney Mitchell DOB: Oct 22, 1995 MRN: [id number]  Date of admission: 02/16/2016 Delivering MD: Michaele OfferMUMAW, ELIZABETH WOODLAND   Date of discharge: 02/19/2016  Admitting diagnosis: 39w ctx 3-4 min apart, lots of pressure Intrauterine pregnancy: 4871w4d     Secondary diagnosis:  Active Problems:   * No active hospital problems. *  Additional problems: hx anemia     Discharge diagnosis: Term Pregnancy Delivered                                                                                                Post partum procedures:none  Augmentation: AROM and Pitocin  Complications: None  Hospital course:  Onset of Labor With Vaginal Delivery     21 y.o. yo [id number] at 8571w4d was admitted in Latent Labor on 02/16/2016. Patient had an uncomplicated labor course as follows:  Membrane Rupture Time/Date: 7:03 AM ,02/17/2016   Intrapartum Procedures: Episiotomy: None [1]                                         Lacerations:  None [1]  Patient had a delivery of a Viable infant. 02/17/2016  Information for the patient's newborn:  Horatio Pelarks, Boy Jesselyn [[id number]][[id number]]  Delivery Method: Vaginal, Spontaneous Delivery (Filed from Delivery Summary)    Pateint had an uncomplicated postpartum course.  She is ambulating, tolerating a regular diet, passing flatus, and urinating well. Patient is discharged home in stable condition on 02/19/16.   Physical exam  Vitals:   02/17/16 2117 02/18/16 0645 02/18/16 1801 02/19/16 0500  BP: 126/78 120/60 123/75 120/72  Pulse: 88 71 79 73  Resp: 16 18 18 18   Temp: 99.3 F (37.4 C) 98.2 F (36.8 C) 98.6 F (37 C) 98.4 F (36.9 C)  TempSrc: Oral Oral Oral Oral  SpO2:      Weight:      Height:       General: alert and cooperative Lochia: appropriate Uterine Fundus: firm Incision: N/A DVT Evaluation: No evidence of DVT seen on physical exam. Labs: Lab Results  Component Value Date   WBC 12.2 (H) 02/16/2016   HGB 10.3 (L)  02/16/2016   HCT 31.8 (L) 02/16/2016   MCV 74.6 (L) 02/16/2016   PLT 273 02/16/2016   CMP Latest Ref Rng & Units 11/10/2015  Glucose 65 - 99 mg/dL 130(Q101(H)  BUN 6 - 20 mg/dL 8  Creatinine 6.570.44 - 8.461.00 mg/dL 9.620.64  Sodium 952135 - 841145 mmol/L 136  Potassium 3.5 - 5.1 mmol/L 3.9  Chloride 101 - 111 mmol/L 105  CO2 22 - 32 mmol/L 24  Calcium 8.9 - 10.3 mg/dL 9.2  Total Protein 6.0 - 8.3 g/dL -  Total Bilirubin 0.3 - 1.2 mg/dL -  Alkaline Phos 39 - 324117 U/L -  AST 0 - 37 U/L -  ALT 0 - 35 U/L -    Discharge instruction: per After Visit Summary and "Baby and Me Booklet".  After visit meds:  Allergies as of  02/19/2016   No Known Allergies     Medication List    TAKE these medications   ibuprofen 600 MG tablet Commonly known as:  ADVIL,MOTRIN Take 1 tablet (600 mg total) by mouth every 6 (six) hours as needed.   prenatal vitamin w/FE, FA 29-1 MG Chew chewable tablet Chew 3 tablets by mouth daily at 12 noon.       Diet: routine diet  Activity: Advance as tolerated. Pelvic rest for 6 weeks.   Outpatient follow up:6 weeks Follow up Appt:Future Appointments Date Time Provider Department Center  03/24/2016 8:40 AM Duane Lope, NP [facility]   Follow up Visit:No Follow-up on file.  Postpartum contraception: Progesterone only pills  Newborn Data: Live born female  Birth Weight: 6 lb 10.9 oz (3030 g) APGAR: 9, 9  Baby Feeding: Breast Disposition:home with mother   02/19/2016 Cam Hai, CNM  9:25 AM

## 2016-03-08 ENCOUNTER — Telehealth: Payer: Self-pay

## 2016-03-08 NOTE — Telephone Encounter (Signed)
Pt called and stated that the paperwork that her jon received was not completed could someone please give me a callback.  Called pt and I apologized to pt that I do not see any paperwork that we have completed.  I verified with pt if she paid $15 and a ROI for us to complete the paperwork.  Pt stated no she has not paid nor filled out a ROI.  I advised pt to please contact her HR and get another copy of the paperwork that she needs, bring it to the front office along with her payment, and to fill out ROI.  Pt stated that she will get everything taking care of today.  Pt asked when will paperwork be completed.  I informed pt that it will be completed by end of day tomorrow.  Pt stated understanding with no further questions.

## 2016-03-10 ENCOUNTER — Telehealth: Payer: Self-pay

## 2016-03-10 NOTE — Telephone Encounter (Signed)
Notified pt that her FMLA paperwork is completed that she can come to front office to pick up.  Pt stated understanding.

## 2016-03-24 ENCOUNTER — Ambulatory Visit: Payer: Commercial Managed Care - PPO | Admitting: Obstetrics and Gynecology

## 2016-03-31 ENCOUNTER — Encounter: Payer: Self-pay | Admitting: Advanced Practice Midwife

## 2016-03-31 ENCOUNTER — Encounter: Payer: Self-pay | Admitting: Family Medicine

## 2016-03-31 ENCOUNTER — Ambulatory Visit (INDEPENDENT_AMBULATORY_CARE_PROVIDER_SITE_OTHER): Payer: Commercial Managed Care - PPO | Admitting: Advanced Practice Midwife

## 2016-03-31 DIAGNOSIS — Z3202 Encounter for pregnancy test, result negative: Secondary | ICD-10-CM | POA: Diagnosis not present

## 2016-03-31 LAB — POCT PREGNANCY, URINE: PREG TEST UR: NEGATIVE

## 2016-03-31 NOTE — Progress Notes (Signed)
Subjective:     Courtney Mitchell is a 21 y.o. female who presents for a postpartum visit. She is 6 weeks postpartum following a spontaneous vaginal delivery. I have fully reviewed the prenatal and intrapartum course. The delivery was at 39.4 gestational weeks. Outcome: spontaneous vaginal delivery. Anesthesia: epidural. Postpartum course has been unremarkable. Baby's course has been unremarkable. Baby is feeding by breast. Bleeding no bleeding. Bowel function is normal. Bladder function is normal. Patient is sexually active. Contraception method is none. Depression/anxiety screening: negative.  Happy with her birth. Did not deliver in water, but labored in tub.  Got epidural.   Doing very well breastfeeding.   The following portions of the patient's history were reviewed and updated as appropriate: allergies, current medications, past family history, past medical history, past social history, past surgical history and problem list.  Review of Systems Pertinent items are noted in HPI.   Objective:    There were no vitals taken for this visit.  General:  alert, cooperative and no distress   Breasts:  inspection negative, no nipple discharge or bleeding, no masses or nodularity palpable  Lungs: clear to auscultation bilaterally  Heart:  regular rate and rhythm, S1, S2 normal, no murmur, click, rub or gallop  Abdomen: soft, non-tender; bowel sounds normal; no masses,  no organomegaly   Vulva:  not evaluated  Vagina: not evaluated  Cervix:  n/a  Corpus: not examined  Adnexa:  not evaluated  Rectal Exam: Not performed.        Assessment:     Normal postpartum exam. Pap smear not done at today's visit.   Plan:    1. Contraception: IUD 2.  Plans Mirena IUD but had IC recently .    Will abstain and return in 2 weeks.  Discussed side effects, insertion and string checks.  Has pamphlet 3. Follow up in: 2 weeks or as needed.

## 2016-03-31 NOTE — Patient Instructions (Signed)

## 2016-04-01 ENCOUNTER — Encounter: Payer: Self-pay | Admitting: Advanced Practice Midwife

## 2016-04-16 ENCOUNTER — Encounter: Payer: Self-pay | Admitting: Obstetrics & Gynecology

## 2016-04-16 ENCOUNTER — Ambulatory Visit (INDEPENDENT_AMBULATORY_CARE_PROVIDER_SITE_OTHER): Payer: Commercial Managed Care - PPO | Admitting: Obstetrics & Gynecology

## 2016-04-16 VITALS — BP 132/74 | HR 79 | Ht 64.0 in | Wt 177.0 lb

## 2016-04-16 DIAGNOSIS — Z3202 Encounter for pregnancy test, result negative: Secondary | ICD-10-CM | POA: Diagnosis not present

## 2016-04-16 DIAGNOSIS — Z30430 Encounter for insertion of intrauterine contraceptive device: Secondary | ICD-10-CM | POA: Diagnosis not present

## 2016-04-16 DIAGNOSIS — Z30018 Encounter for initial prescription of other contraceptives: Secondary | ICD-10-CM

## 2016-04-16 LAB — POCT PREGNANCY, URINE: PREG TEST UR: NEGATIVE

## 2016-04-16 LAB — POCT URINALYSIS DIP (DEVICE)
Bilirubin Urine: NEGATIVE
GLUCOSE, UA: NEGATIVE mg/dL
HGB URINE DIPSTICK: NEGATIVE
LEUKOCYTES UA: NEGATIVE
NITRITE: NEGATIVE
Protein, ur: NEGATIVE mg/dL
Specific Gravity, Urine: 1.02 (ref 1.005–1.030)
Urobilinogen, UA: 0.2 mg/dL (ref 0.0–1.0)
pH: 5.5 (ref 5.0–8.0)

## 2016-04-16 MED ORDER — LEVONORGESTREL 20 MCG/24HR IU IUD: 1.0000 | INTRAUTERINE_SYSTEM | Freq: Once | INTRAUTERINE | 0 refills | Status: DC

## 2016-04-16 NOTE — Addendum Note (Signed)
Addended by: Cheree Ditto, DEMETRICE A on: 04/16/2016 11:55 AM   Modules accepted: Orders

## 2016-04-16 NOTE — Progress Notes (Signed)
   Subjective:    Patient ID: Courtney Mitchell, female    DOB: April 02, 1995, 21 y.o.   MRN: [id number]  HPI 21 yo lady here for Mirena insertion. She has no had unprotected IC in the last 2 weeks.   Review of Systems     Objective:   Physical Exam WNWHBFNAD Breathing, conversing, and ambulating normally UPT negative, consent signed, Time out procedure done. Cervix prepped with betadine and grasped with a single tooth tenaculum. Mirena was easily placed and the strings were cut to 3-4 cm. Uterus sounded to 9 cm. She tolerated the procedure well.     Assessment & Plan:  Contraception- Mirena Rec back up for 2 weeks RTC 4 weeks for string check

## 2016-04-19 NOTE — Addendum Note (Signed)
Addended by: Garret Reddish on: 04/19/2016 09:23 AM   Modules accepted: Orders

## 2016-05-05 ENCOUNTER — Encounter: Payer: Self-pay | Admitting: General Practice

## 2016-05-05 ENCOUNTER — Ambulatory Visit (INDEPENDENT_AMBULATORY_CARE_PROVIDER_SITE_OTHER): Payer: Commercial Managed Care - PPO | Admitting: Obstetrics & Gynecology

## 2016-05-05 VITALS — BP 111/79 | HR 84 | Wt 174.9 lb

## 2016-05-05 DIAGNOSIS — Z30431 Encounter for routine checking of intrauterine contraceptive device: Secondary | ICD-10-CM | POA: Diagnosis not present

## 2016-05-05 DIAGNOSIS — O924 Hypogalactia: Secondary | ICD-10-CM | POA: Diagnosis not present

## 2016-05-05 NOTE — Progress Notes (Signed)
Spoke with patient regarding concerns of decrease supply since IUD insertion. Patient states she hasn't been able to pump out as much as she was previously and her baby seems fussy at the breast. Asked patient when she went back to work. Patient states she went back to work earlier this month around the time that she had her IUD inserted. Patient reports difficulty with boss allowing her time to pump at work. Patient states sometimes she is only able to pump every 4-5 hours. Discussed with patient that the decrease in supply she is seeing is most likely related to her return to work as this is commonly when a woman's supply starts to drop. Discussed with patient that supply is based off frequency and duration of stimulation and how well the milk is being removed. Discussed that progesterone based birth controls do not effect her milk supply. Discussed with patient that some mothers find fenugreek, mothers milk tea, lactation cookies, or brewers yeast helpful. Advised that if she does use fenugreek it will smell like maple syrup in her and the baby's urine. Discussed that ultimately she needs to be pumping every 2-3 hours at work and told her we would provide a letter for her boss. Told patient if that isn't sufficient she should go to the HR department as it is a mandated law to provide women that time to pump. Patient verbalized understanding to all & states that she has been drinking tea and has found that helpful. Letter provided and gave handout on increasing milk supply.  Marylynn Pearson RN, BSN, [employer]

## 2016-05-05 NOTE — Progress Notes (Signed)
   Subjective:    Patient ID: Courtney Mitchell, female    DOB: 12/27/1995, 21 y.o.   MRN: [id number]  HPI 21 yo S AA P1 here 3 weeks after her Liletta insertion. She has gone back to work at [employer] and is worried because her milk supply has decreased. She has had some bleeding with the IUD and is concerned that this is what has lead to her decreased milk supply. She is drinking plenty of water. The main issue is probably that she is only allowed to pump at work q 5 hours.   Review of Systems She used the Nexplanon for about a year in the past but had it removed due to irregular bleeding.    Objective:   Physical Exam Pleasant WNWHBFNAD Breathing, conversing, and ambulating normally Her IUD strings are visible No vaginal bleeding noted at the time of the exam.       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 I have reassured her about the Liletta. RTC 3 months

## 2016-05-05 NOTE — Progress Notes (Signed)
Pt unhappy with her iud. States that she keeps bleeding and it is messing up her milk supply.

## 2016-05-14 ENCOUNTER — Ambulatory Visit: Payer: Commercial Managed Care - PPO | Admitting: Obstetrics & Gynecology

## 2016-05-14 IMAGING — US US OB COMP LESS 14 WK
1 series · 15 of 27 positions shown · non-contrast
Comparison: None.

CLINICAL DATA: Patient referred from outside facility for no fetal
heart tones.

EXAM:
OBSTETRIC <14 WK US AND TRANSVAGINAL OB US
TECHNIQUE: Both transabdominal and transvaginal ultrasound examinations were
performed for complete evaluation of the gestation as well as the
maternal uterus, adnexal regions, and pelvic cul-de-sac.
Transvaginal technique was performed to assess early pregnancy.

[Series 1: us ob comp less 14 wk · 27 acquisitions, 15 frames shown]
[im 1/27]
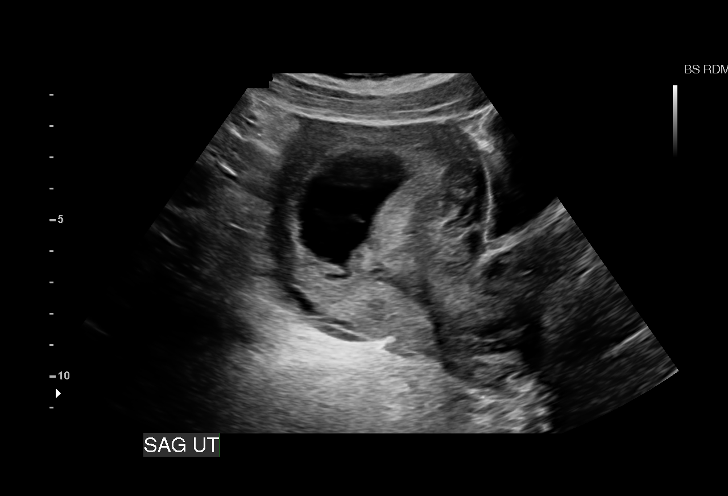
[im 3/27]
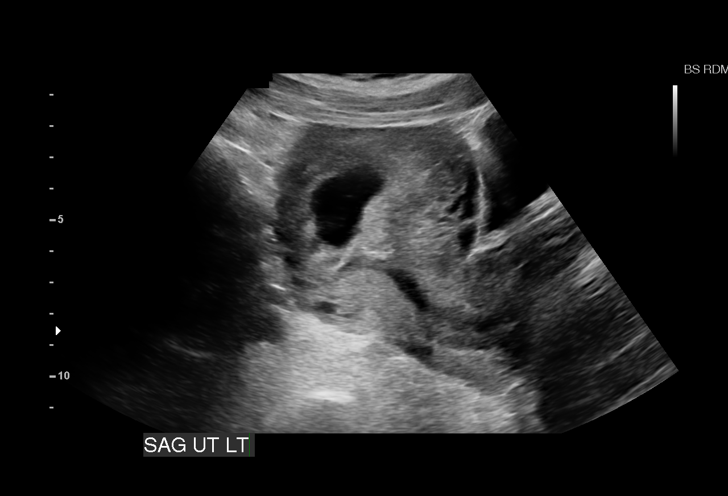
[im 5/27]
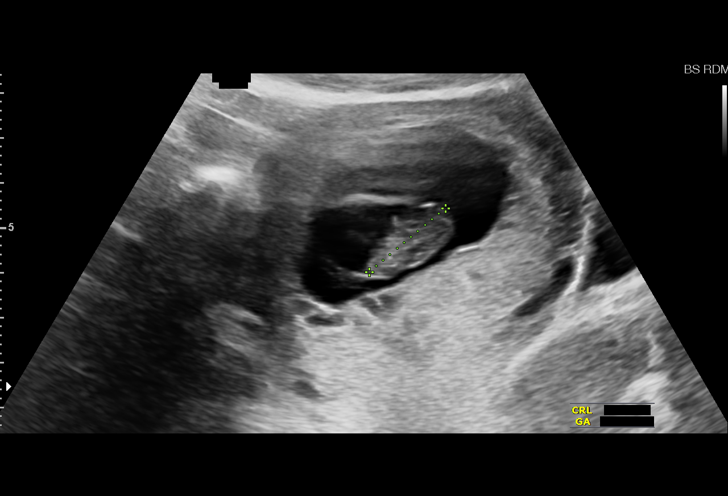
[im 7/27]
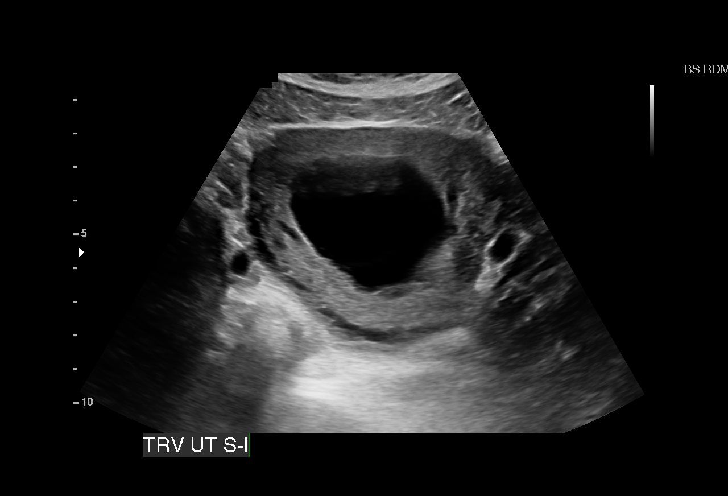
[im 9/27]
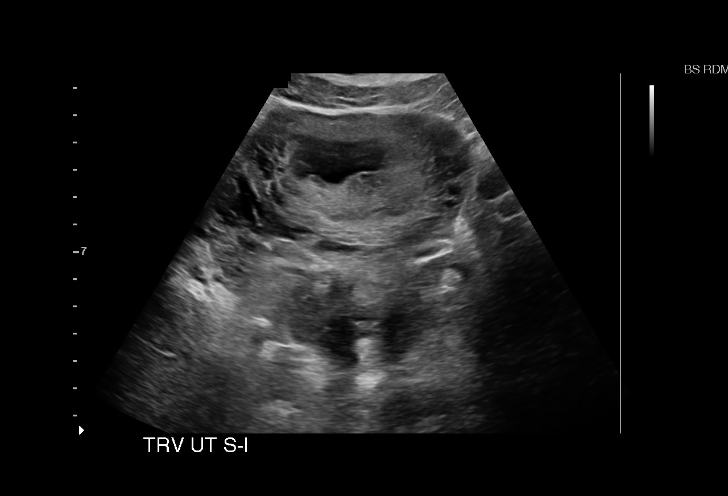
[im 10/27]
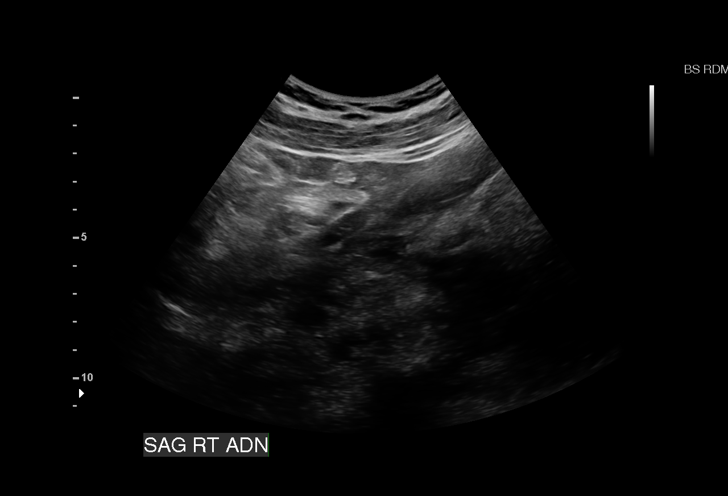
[im 12/27]
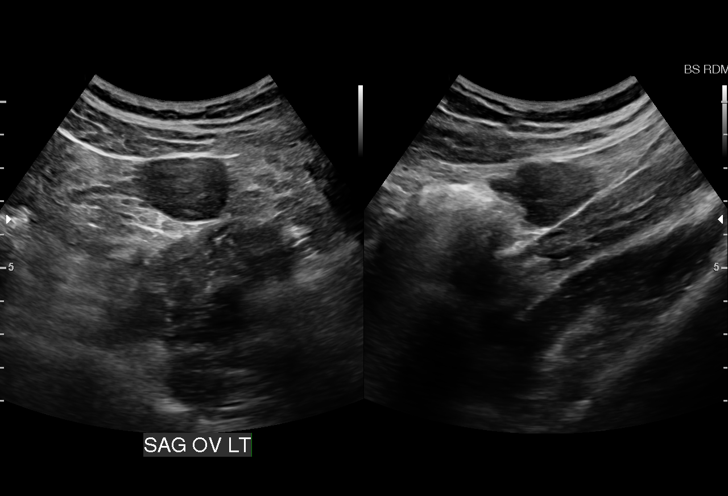
[im 14/27]
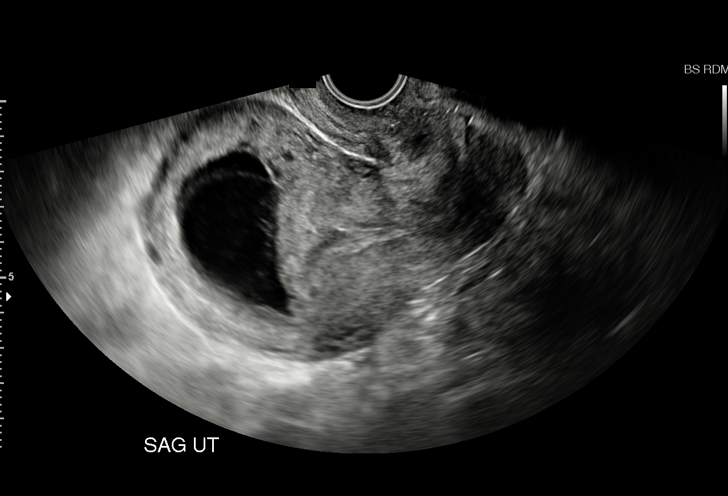
[im 16/27]
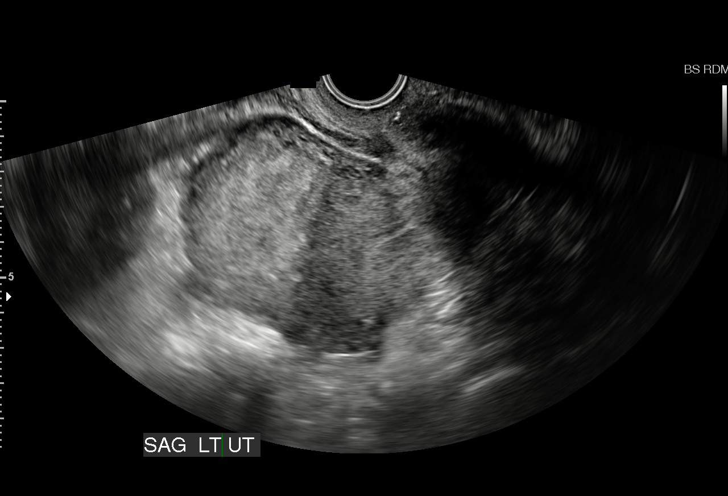
[im 18/27]
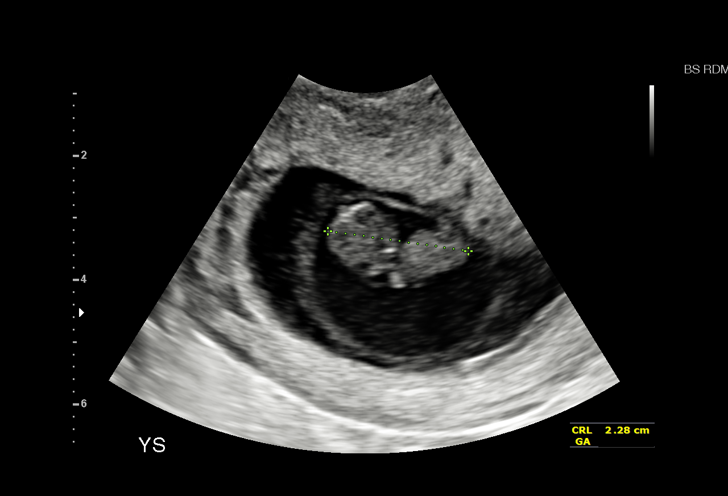
[im 19/27]
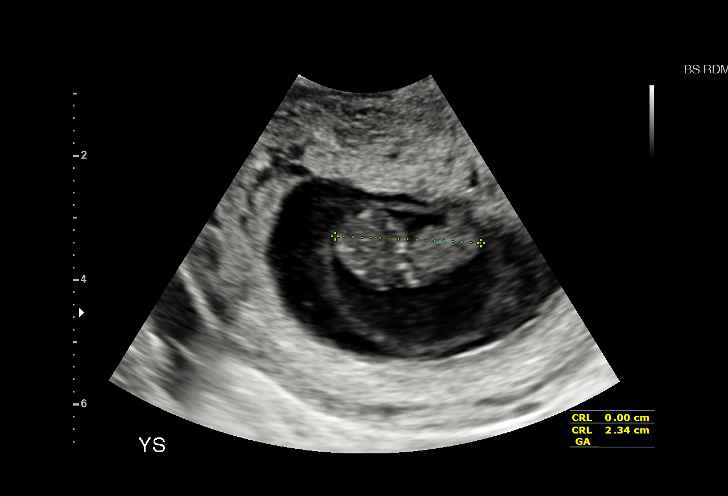
[im 21/27]
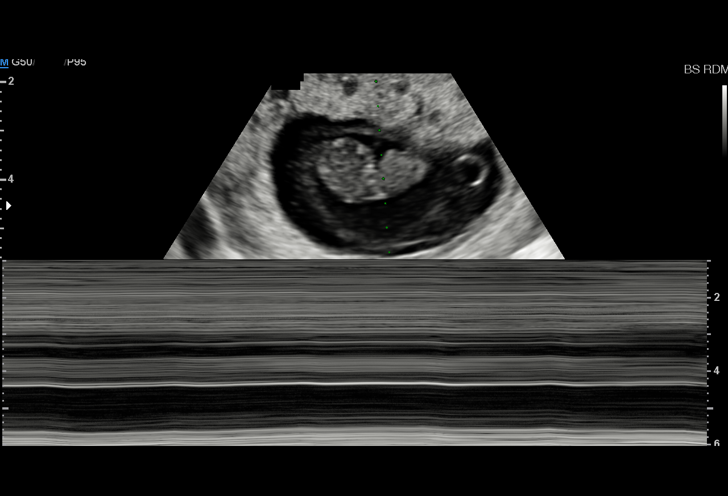
[im 23/27]
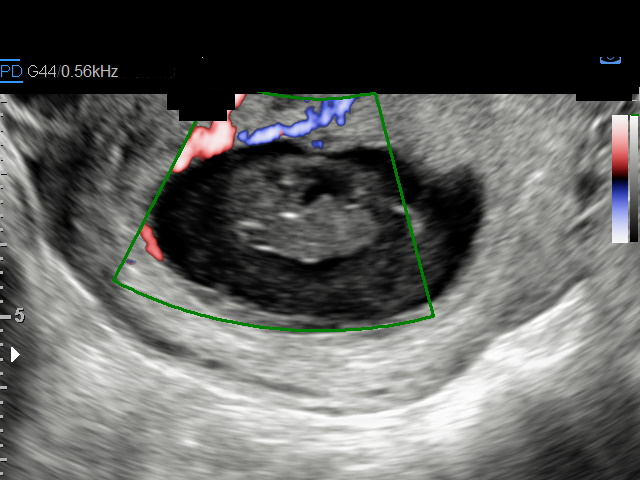
[im 25/27]
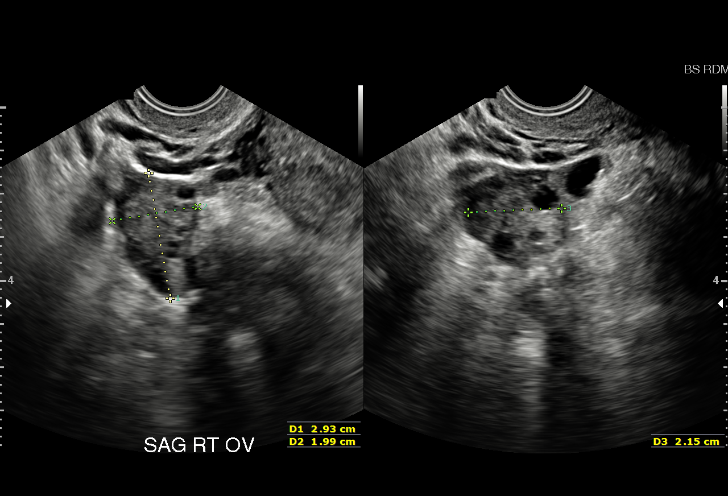
[im 27/27]
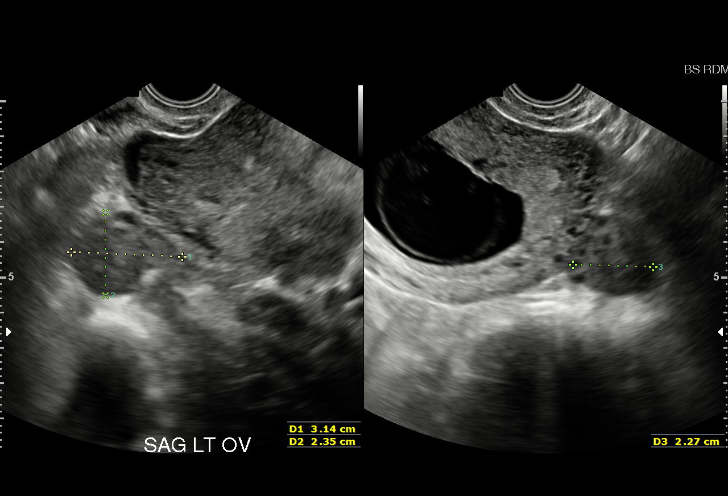

[15 of 27 positions shown; findings below may reference images not displayed]

FINDINGS: Intrauterine gestational sac: Visualized/normal in shape.

Yolk sac:  Present

Embryo:  Present

Cardiac Activity: Not present

Heart Rate: 0  bpm

CRL:  15.7  mm   8 w   0 d

Subchorionic hemorrhage:  No subchorionic hemorrhage.

Maternal uterus/adnexae: Normal right and left ovaries. No pelvic
free fluid.
IMPRESSION: Crown-rump length of 15.7 mm without fetal heartbeat. Findings meet
definitive criteria for failed pregnancy. This follows SRU consensus
guidelines: Diagnostic Criteria for Nonviable Pregnancy Early in the
First Trimester. N Engl J Med 2405;[DATE].

## 2016-06-01 ENCOUNTER — Encounter: Payer: Self-pay | Admitting: *Deleted

## 2016-06-25 ENCOUNTER — Encounter: Payer: Commercial Managed Care - PPO | Admitting: Obstetrics & Gynecology

## 2016-06-25 IMAGING — US US TRANSVAGINAL NON-OB
1 series · 15 of 25 positions shown · non-contrast
Comparison: None

CLINICAL DATA: Heavy vaginal bleeding. Post spontaneous fetal
demise 6 weeks prior.

EXAM:
TRANSABDOMINAL AND TRANSVAGINAL ULTRASOUND OF PELVIS
TECHNIQUE: Both transabdominal and transvaginal ultrasound examinations of the
pelvis were performed. Transabdominal technique was performed for
global imaging of the pelvis including uterus, ovaries, adnexal
regions, and pelvic cul-de-sac. It was necessary to proceed with
endovaginal exam following the transabdominal exam to visualize the
endometrium, left and right ovary.

[Series 1: us transvaginal non-ob · 15 of 42 slices shown]
[im 1/42]
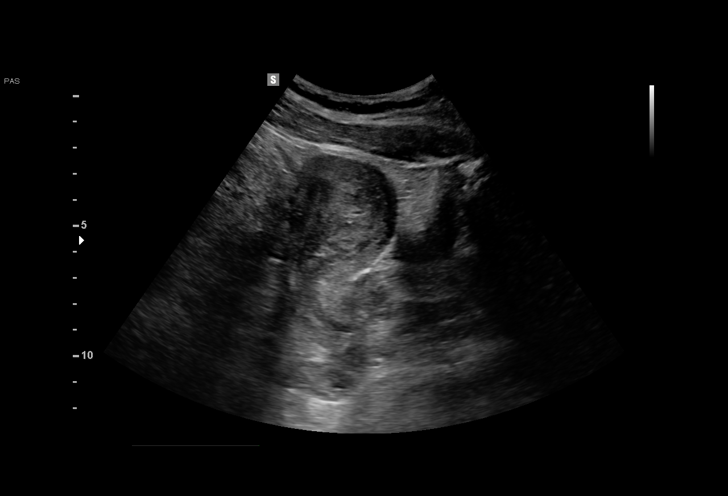
[im 4/42]
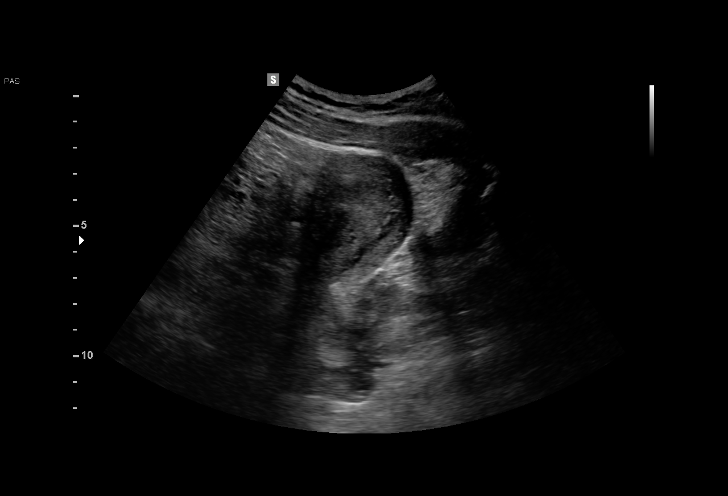
[im 7/42]
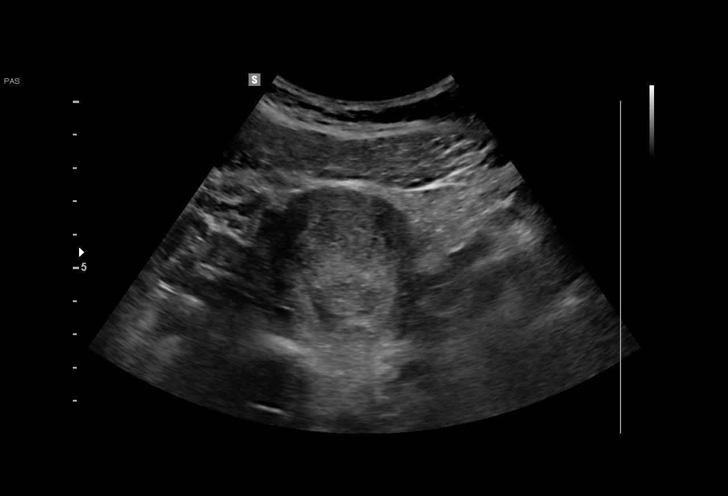
[im 9/42]
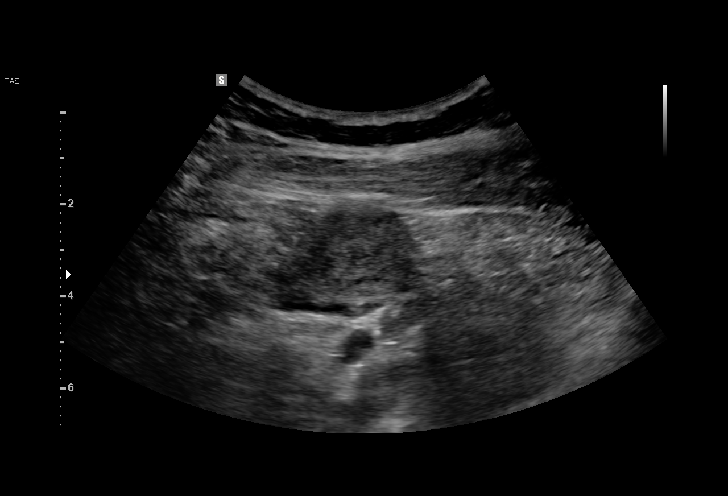
[im 12/42]
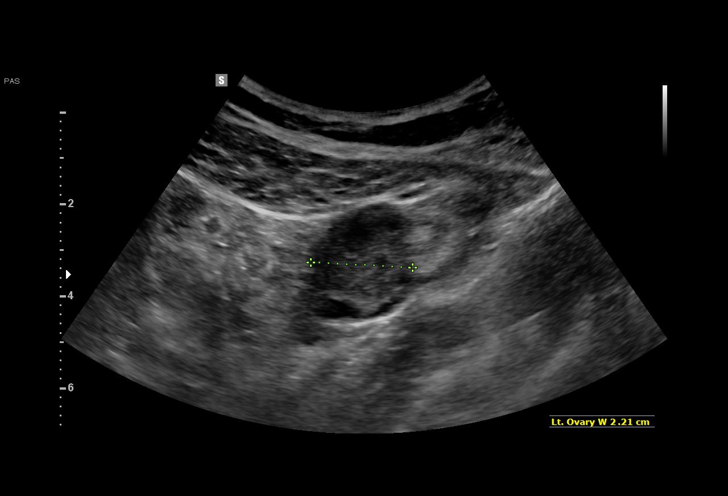
[im 16/42]
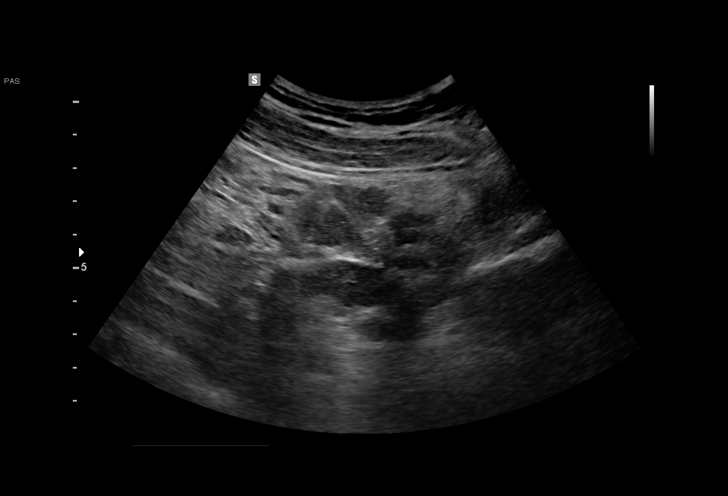
[im 18/42]
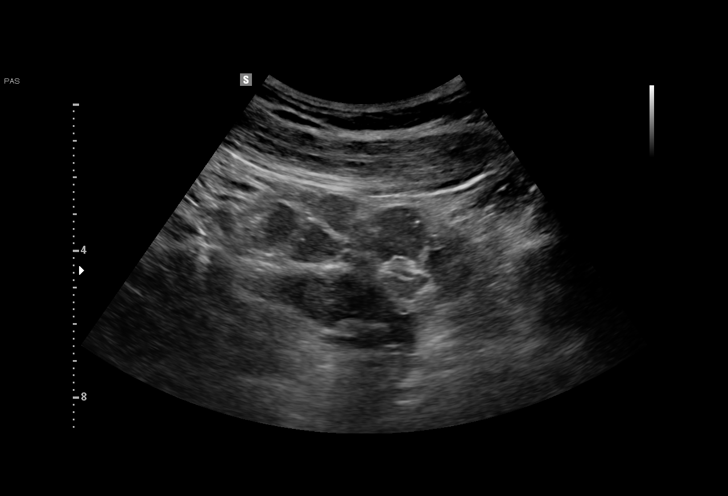
[im 21/42]
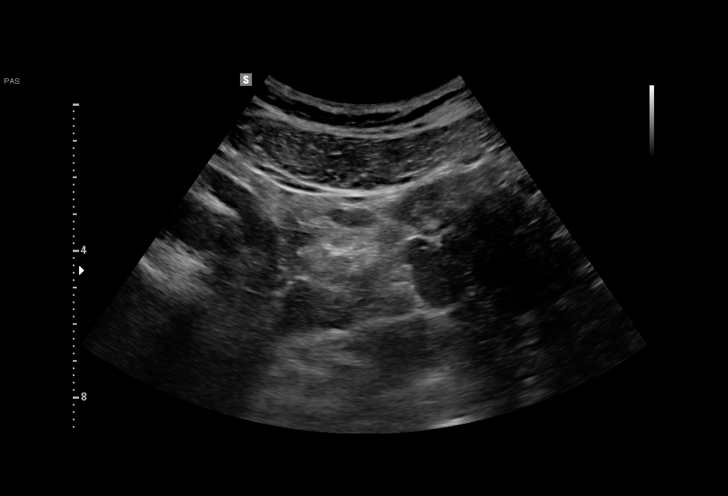
[im 24/42]
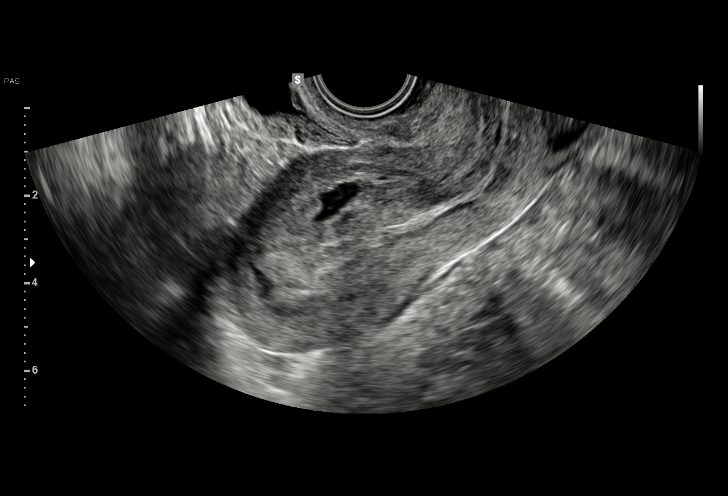
[im 26/42]
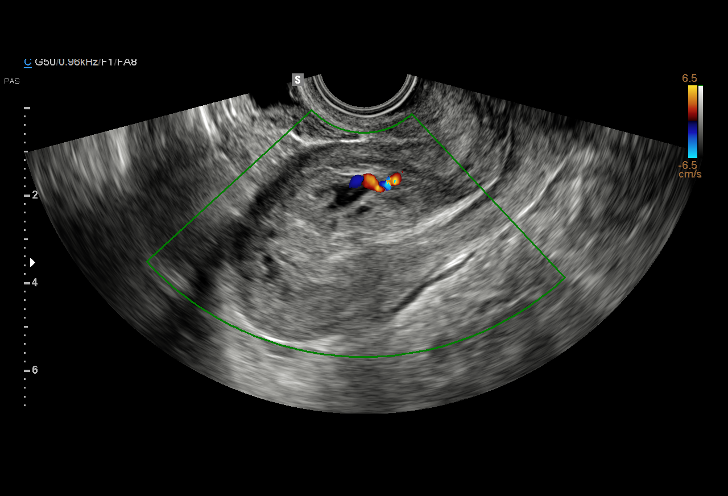
[im 30/42]
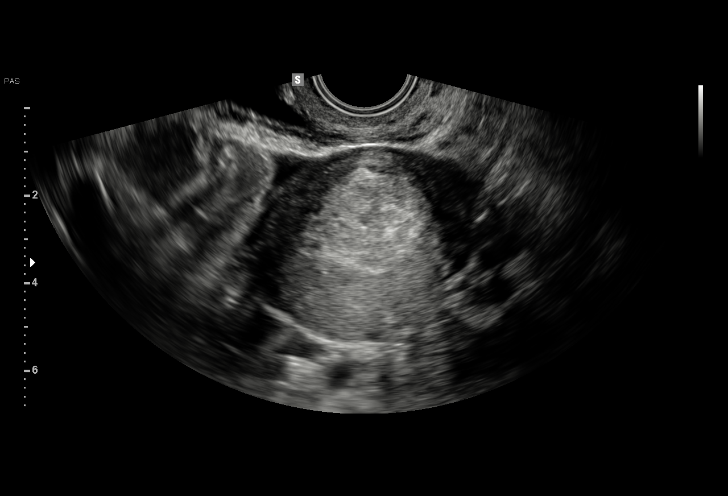
[im 33/42]
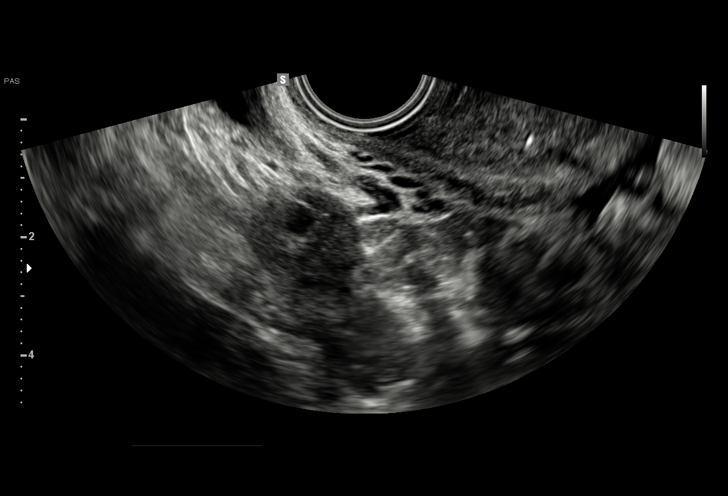
[im 35/42]
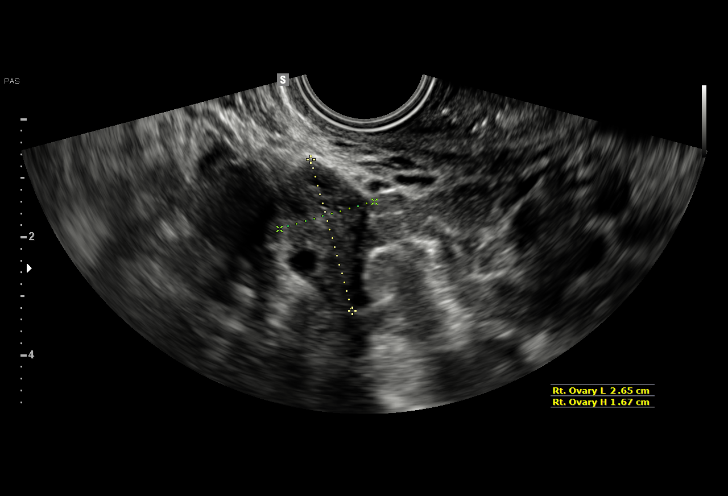
[im 38/42]
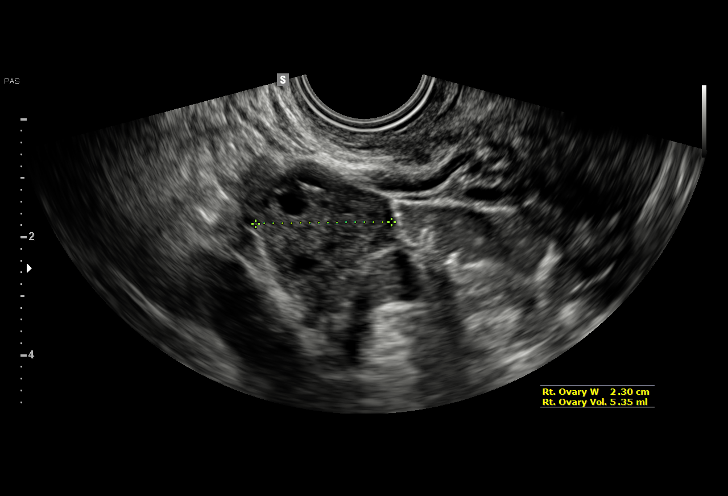
[im 42/42]
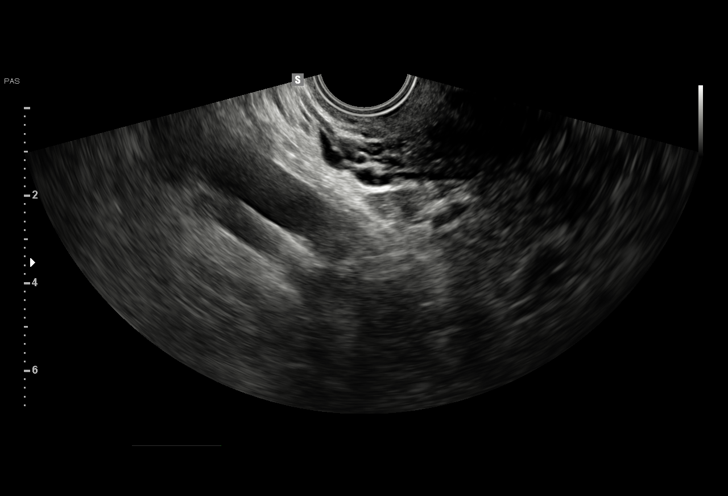

[15 of 25 positions shown; findings below may reference images not displayed]

FINDINGS: Uterus

Measurements: 10.2 x 4.4 x 5.2 cm. No fibroids or other mass
visualized.

Endometrium

Thickness: 21 mm. The endometrium is diffusely heterogeneous with
heterogeneous fluid in the endometrial canal. There is questionable
internal blood flow about the periphery.

Right ovary

Measurements: 2.7 x 1.7 x 2.3 cm. Normal appearance/no adnexal mass.
There are physiologic follicles. Blood flow is noted.

Left ovary

Measurements: 3.2 x 2.3 x 2.2 cm. Normal appearance/no adnexal mass.
There are physiologic follicles. Blood flow is noted.

Other findings

No abnormal free fluid.
IMPRESSION: Heterogeneous endometrial thickening with heterogeneous fluid in the
endometrial canal, question of internal blood flow. Findings are
concerning for retained products of conception given failed
pregnancy 6 weeks prior.

## 2016-06-28 ENCOUNTER — Inpatient Hospital Stay (HOSPITAL_COMMUNITY)
Admission: AD | Admit: 2016-06-28 | Discharge: 2016-06-28 | Discharge status: Against Medical Advice | Payer: Commercial Managed Care - PPO | Source: Ambulatory Visit | Attending: Obstetrics and Gynecology | Admitting: Obstetrics and Gynecology

## 2016-06-28 ENCOUNTER — Encounter (HOSPITAL_COMMUNITY): Payer: Self-pay | Admitting: *Deleted

## 2016-06-28 DIAGNOSIS — Z5321 Procedure and treatment not carried out due to patient leaving prior to being seen by health care provider: Secondary | ICD-10-CM | POA: Insufficient documentation

## 2016-06-28 LAB — COMPREHENSIVE METABOLIC PANEL
ALBUMIN: 4.4 g/dL (ref 3.5–5.0)
ALK PHOS: 96 U/L (ref 38–126)
ALT: 15 U/L (ref 14–54)
ANION GAP: 7 (ref 5–15)
AST: 22 U/L (ref 15–41)
BILIRUBIN TOTAL: 0.5 mg/dL (ref 0.3–1.2)
BUN: 11 mg/dL (ref 6–20)
CALCIUM: 9.3 mg/dL (ref 8.9–10.3)
CO2: 22 mmol/L (ref 22–32)
Chloride: 110 mmol/L (ref 101–111)
Creatinine, Ser: 0.66 mg/dL (ref 0.44–1.00)
GFR calc Af Amer: >60 mL/min (ref >60)
GFR calc non Af Amer: >60 mL/min (ref >60)
GLUCOSE: 101 mg/dL — AB (ref 65–99)
Potassium: 4.2 mmol/L (ref 3.5–5.1)
SODIUM: 139 mmol/L (ref 135–145)
TOTAL PROTEIN: 7.6 g/dL (ref 6.5–8.1)

## 2016-06-28 LAB — URINALYSIS, ROUTINE W REFLEX MICROSCOPIC
BILIRUBIN URINE: NEGATIVE
Glucose, UA: NEGATIVE mg/dL
KETONES UR: NEGATIVE mg/dL
Nitrite: NEGATIVE
PROTEIN: NEGATIVE mg/dL
Specific Gravity, Urine: 1.015 (ref 1.005–1.030)
pH: 6 (ref 5.0–8.0)

## 2016-06-28 LAB — CBC
HEMATOCRIT: 38.5 % (ref 36.0–46.0)
HEMOGLOBIN: 12.8 g/dL (ref 12.0–15.0)
MCH: 27.6 pg (ref 26.0–34.0)
MCHC: 33.2 g/dL (ref 30.0–36.0)
MCV: 83 fL (ref 78.0–100.0)
Platelets: 201 10*3/uL (ref 150–400)
RBC: 4.64 MIL/uL (ref 3.87–5.11)
RDW: 14.5 % (ref 11.5–15.5)
WBC: 6.9 10*3/uL (ref 4.0–10.5)

## 2016-06-28 LAB — POCT PREGNANCY, URINE: Preg Test, Ur: NEGATIVE

## 2016-06-28 NOTE — MAU Note (Signed)
PT SAYS SHE HAD IUD INSERTED  IN April  BY  CLINIC  -  HAS BEEN BLEEDING SINCE April.        SHE CALLED CLINIC   TO HAVE REMOVED.   PAD ON IN TRIAGE-  SMEARS OF  LIGHT BROWN.     FEELS  CRAMPS-  NO MEDS          LAST SEX-  1 WEEK AGO.

## 2016-06-29 ENCOUNTER — Inpatient Hospital Stay (HOSPITAL_COMMUNITY)
Admission: AD | Admit: 2016-06-29 | Discharge: 2016-06-29 | Discharge status: Absent without leave | Payer: Commercial Managed Care - PPO | Attending: Family Medicine | Admitting: Family Medicine

## 2016-07-26 ENCOUNTER — Ambulatory Visit: Payer: Commercial Managed Care - PPO | Admitting: Obstetrics & Gynecology

## 2016-10-27 IMAGING — US US MFM FETAL NUCHAL TRANSLUCENCY
1 series · 15 of 27 positions shown · non-contrast
Comparison: none

[Series 1: us mfm fetal nuchal translucency · 15 of 27 slices shown]
[im 1/27]
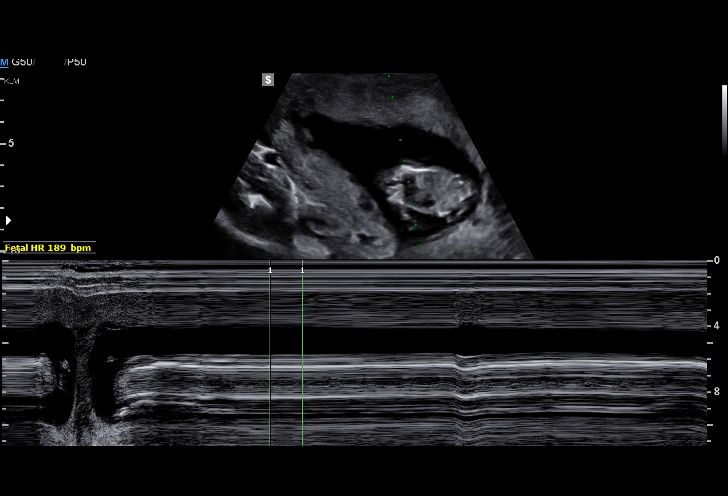
[im 3/27]
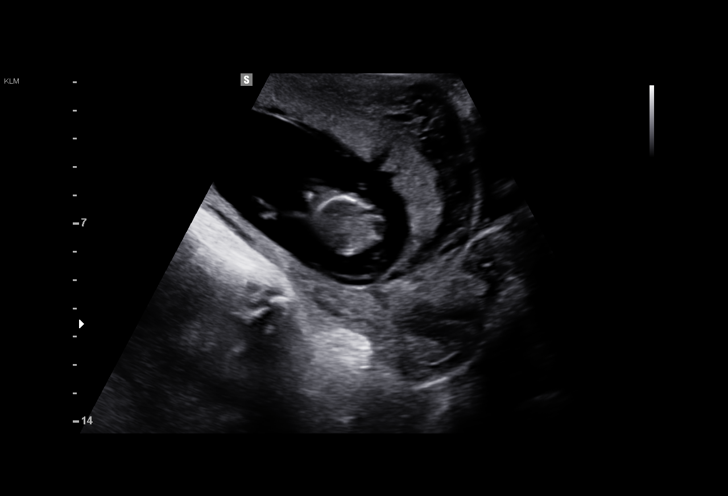
[im 5/27]
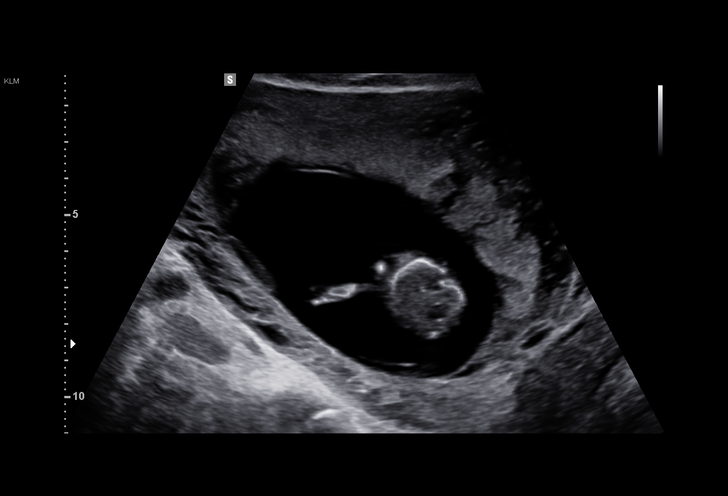
[im 7/27]
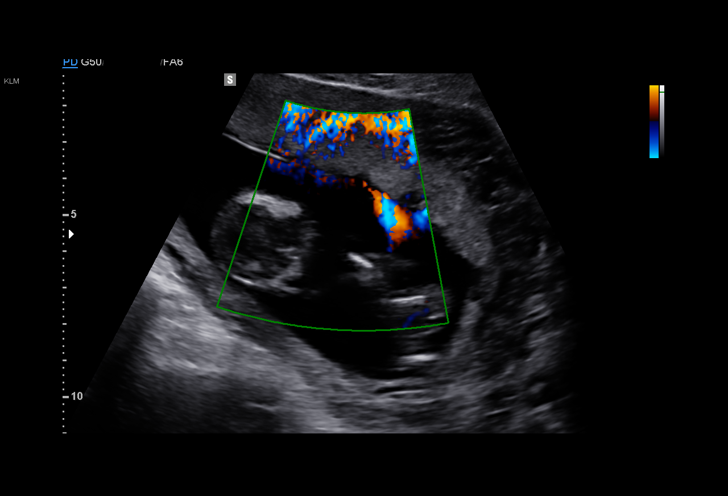
[im 9/27]
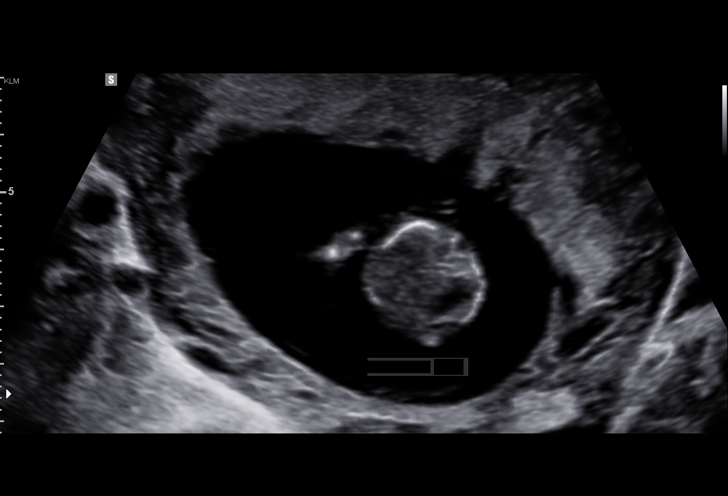
[im 10/27]
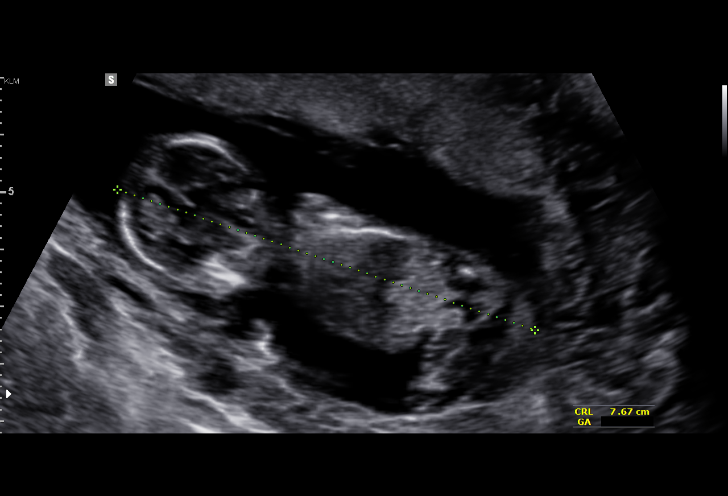
[im 12/27]
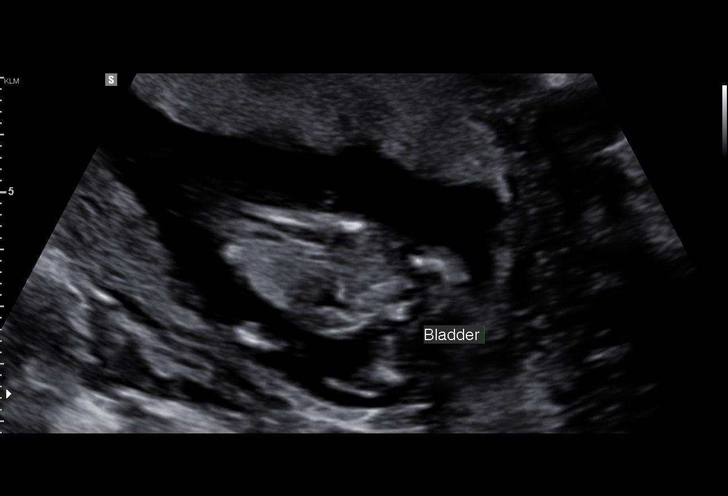
[im 14/27]
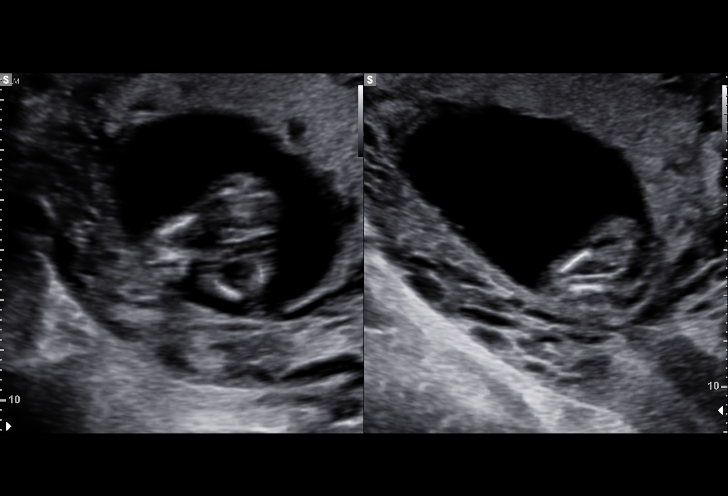
[im 16/27]
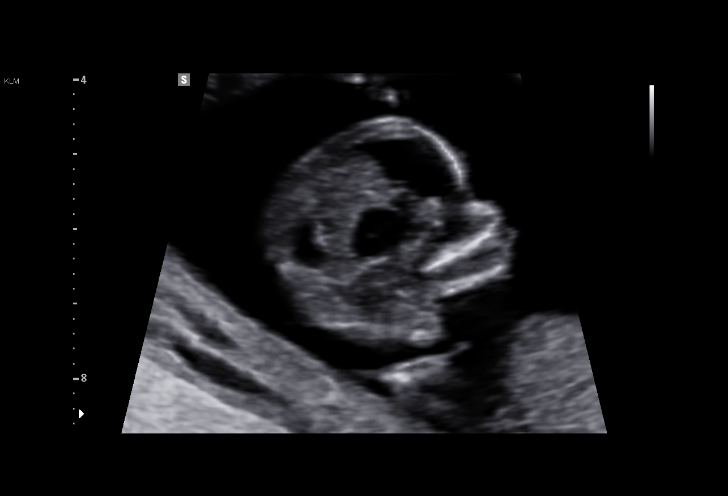
[im 18/27]
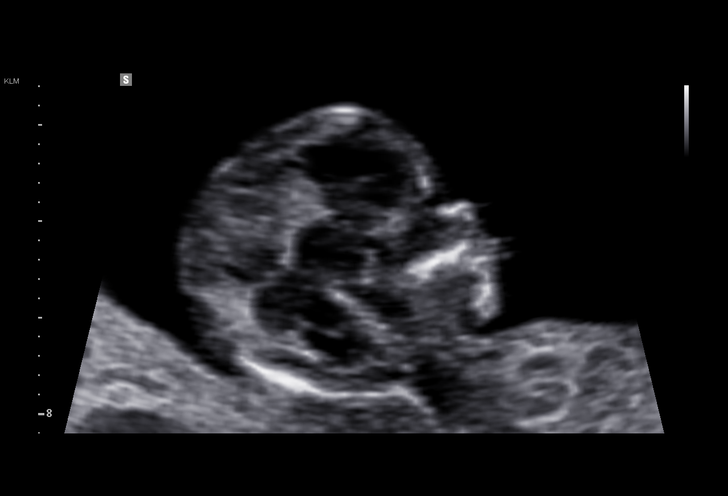
[im 19/27]
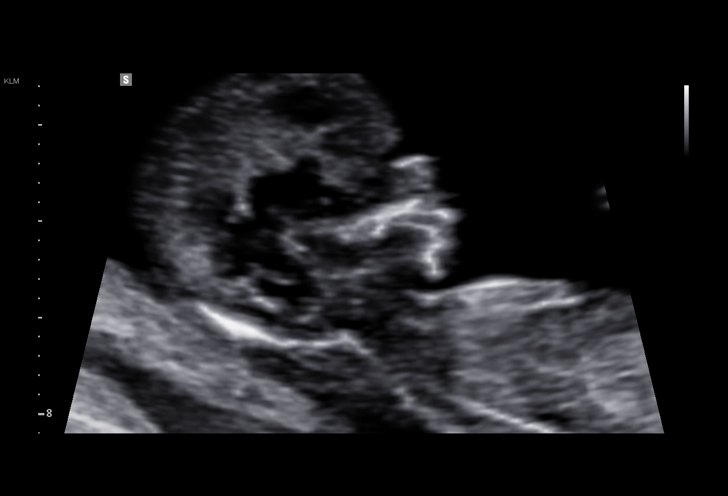
[im 21/27]
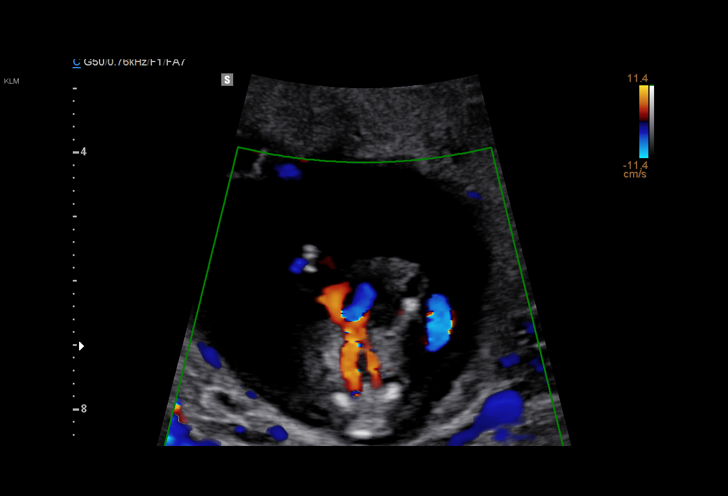
[im 23/27]
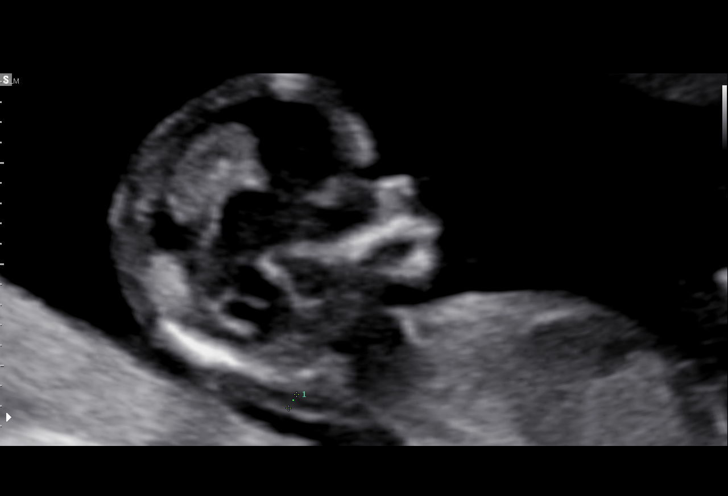
[im 25/27]
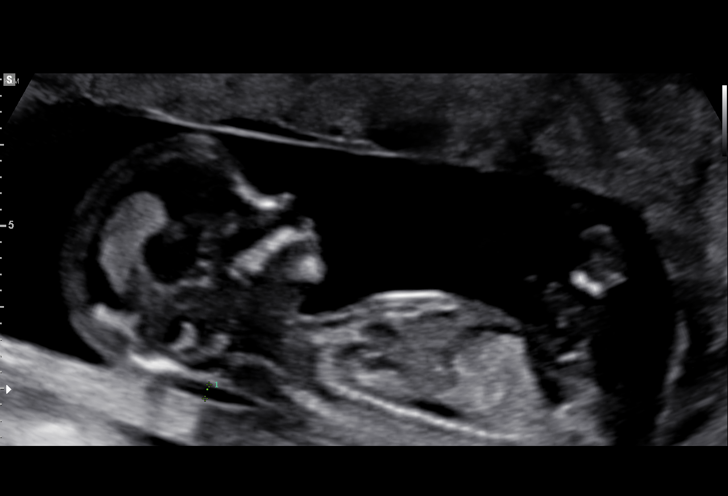
[im 27/27]
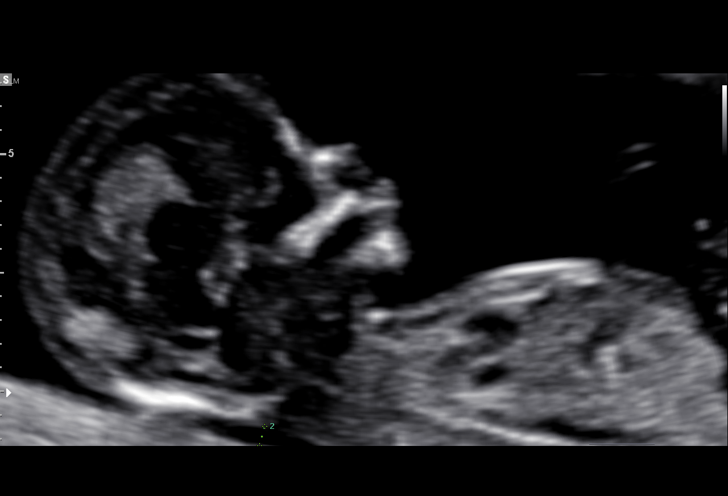

[15 of 27 positions shown; findings below may reference images not displayed]

am)


TRANSLUCENCY

1  DAYANA TINDLE          [id number]      [id number]     [id number]
Indications

12 weeks gestation of pregnancy
First trimester aneuploidy screen (NT)         Z36
OB History

Gravidity:    3         Term:   0        Prem:   0        SAB:   2
TOP:          0       Ectopic:  0        Living: 0
Fetal Evaluation

Num Of Fetuses:     1
Fetal Heart         189
Rate(bpm):
Cardiac Activity:   Observed
Presentation:       Variable
Placenta:           Anterior, above cervical os
P. Cord Insertion:  Visualized

Amniotic Fluid
AFI FV:      Subjectively within normal limits
Gestational Age

LMP:           14w 2d        Date:  05/06/15                 EDD:   02/10/16
Best:          12w 6d     Det. By:  Early Ultrasound         EDD:   02/20/16
(06/25/15)
1st Trimester Genetic Sonogram Screening
CRL:              75  mm    G. Age:   13w 2d                 EDD:   02/17/16
Nuc Trans:       1.7  mm
Nasal Bone:                 Present
Anatomy

Cranium:               Appears normal         Cord Vessels:           Appears normal (3
vessel cord)
Choroid Plexus:        Appears normal         Bladder:                Appears normal
Stomach:               Appears normal, left   Upper Extremities:      Visualized
sided
Abdomen:               Appears normal         Lower Extremities:      Visualized
Cervix Uterus Adnexa

Cervix
Closed
Impression

SIUP at 12+6 weeks
No gross abnormalities identified
NT measurement was within normal limits for this GA; NB
present
Normal amniotic fluid volume
Measurements consistent with early US

Ms. Cuculu was satisfied with US results and did not want
blood work today.
Recommendations

Follow-up ultrasound in 5-6 weeks for detailed anatomic
survey

## 2016-12-21 ENCOUNTER — Telehealth: Payer: Self-pay | Admitting: General Practice

## 2017-01-31 NOTE — Telephone Encounter (Signed)
Opened in error

## 2017-03-24 IMAGING — US US MFM OB LIMITED
1 series · 14 of 24 positions shown · non-contrast
Comparison: none

[Series 1: us mfm ob limited · 24 acquisitions, 14 frames shown]
[im 1/24]
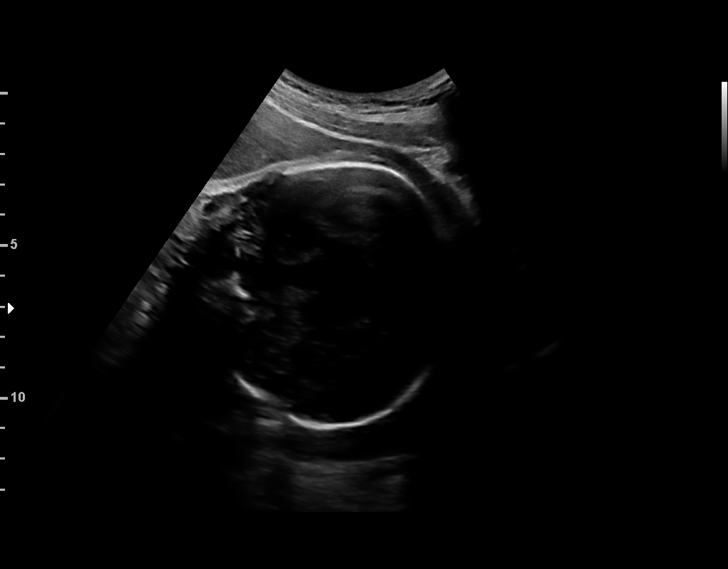
[im 3/24]
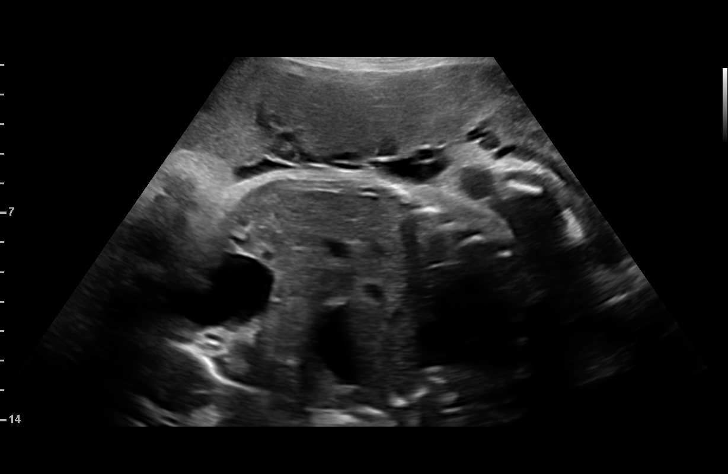
[im 5/24]
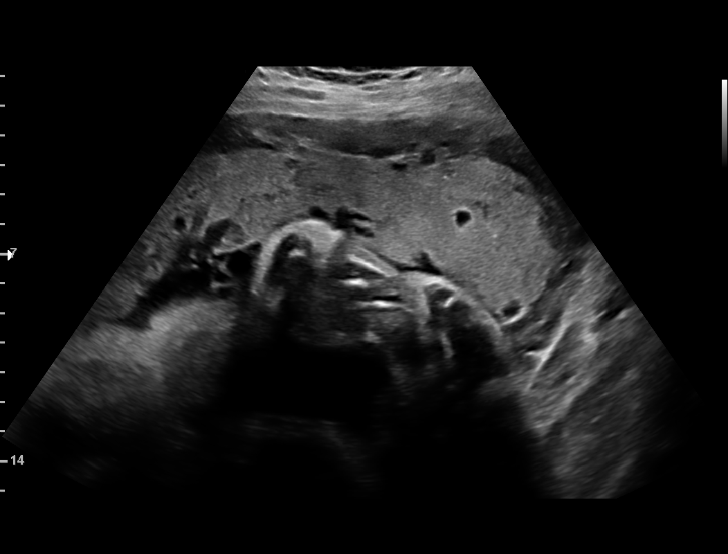
[im 7/24]
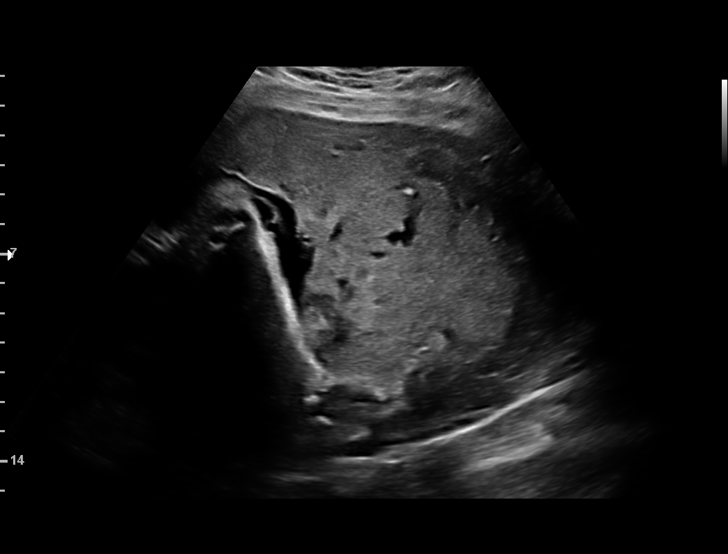
[im 8/24]
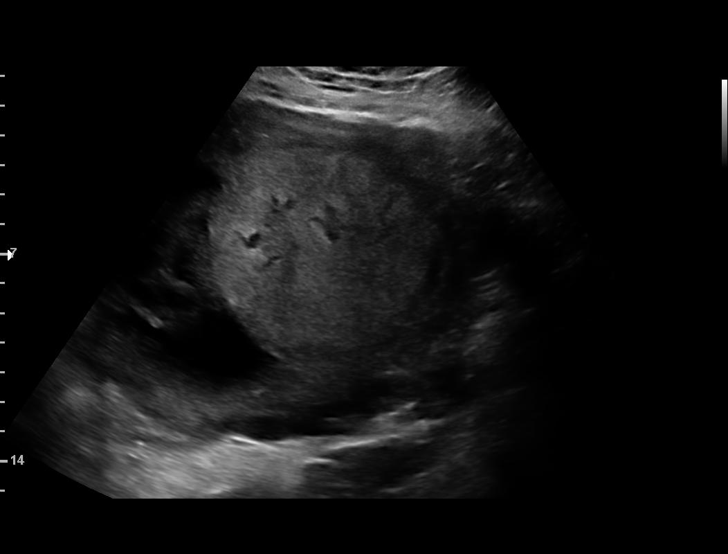
[im 10/24]
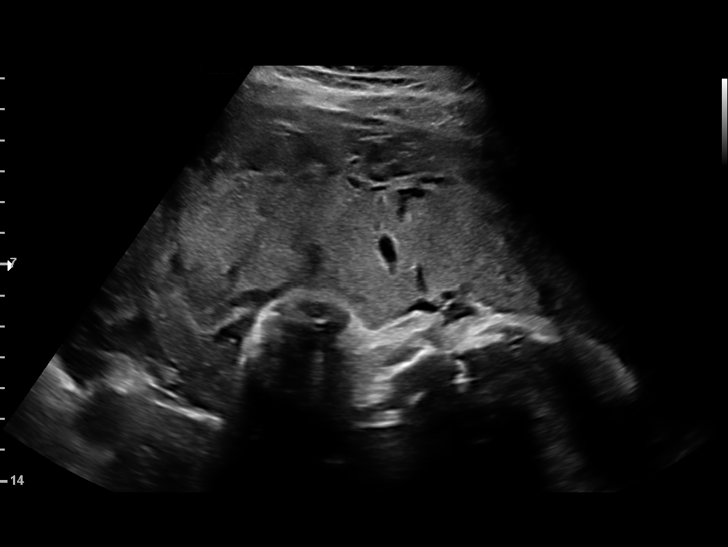
[im 12/24]
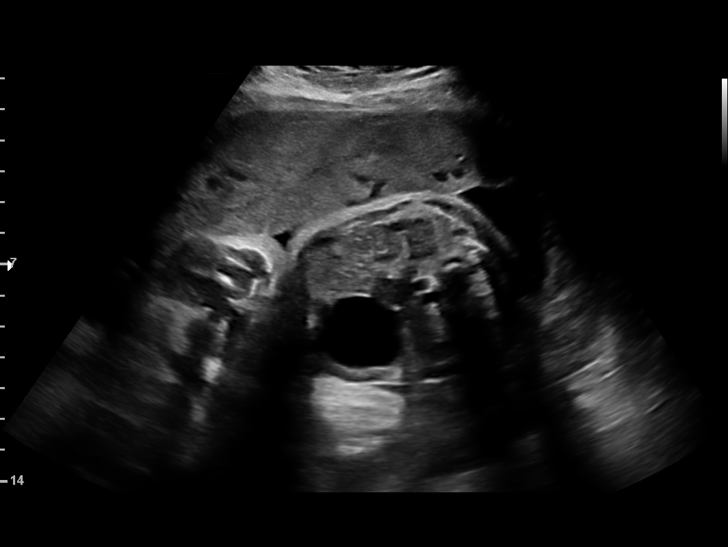
[im 13/24]
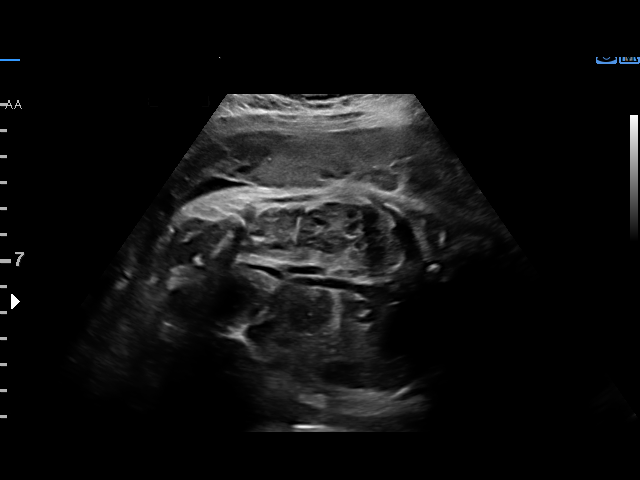
[im 15/24]
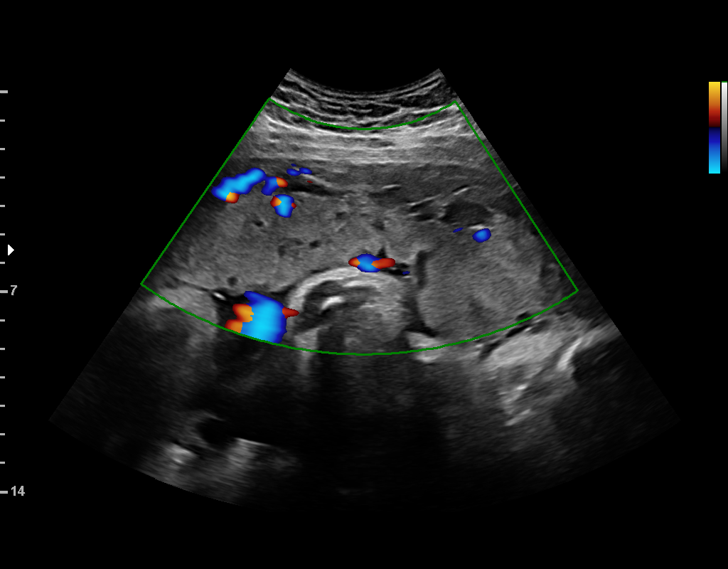
[im 17/24]
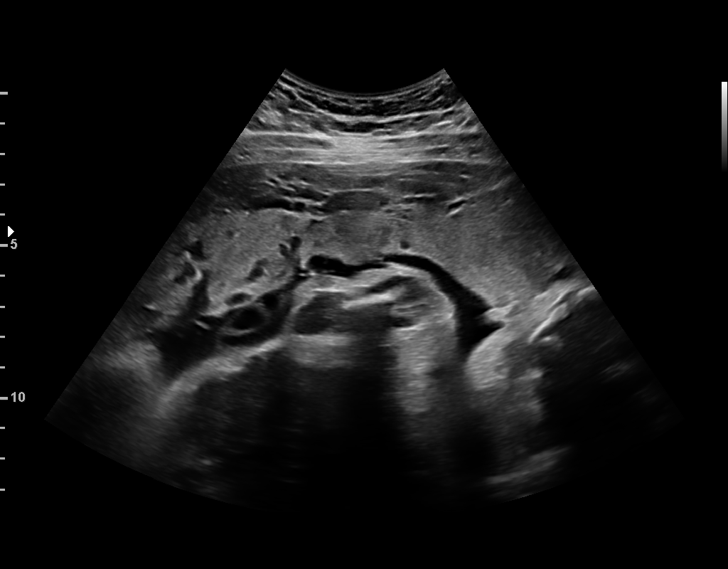
[im 19/24]
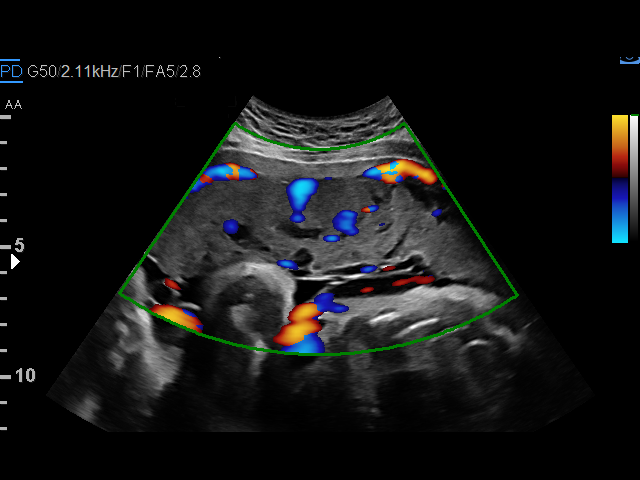
[im 20/24]
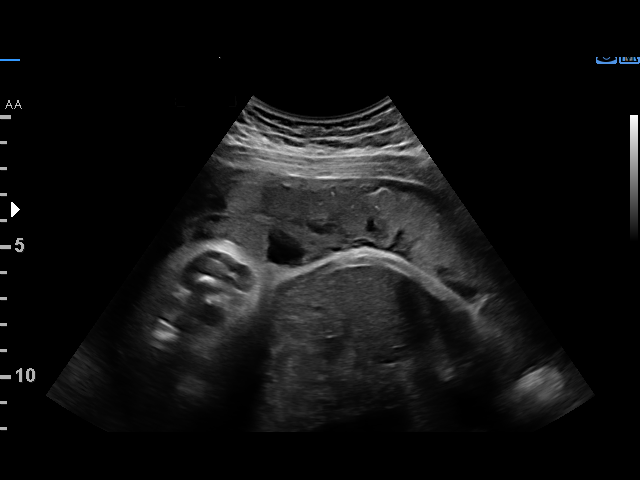
[im 22/24]
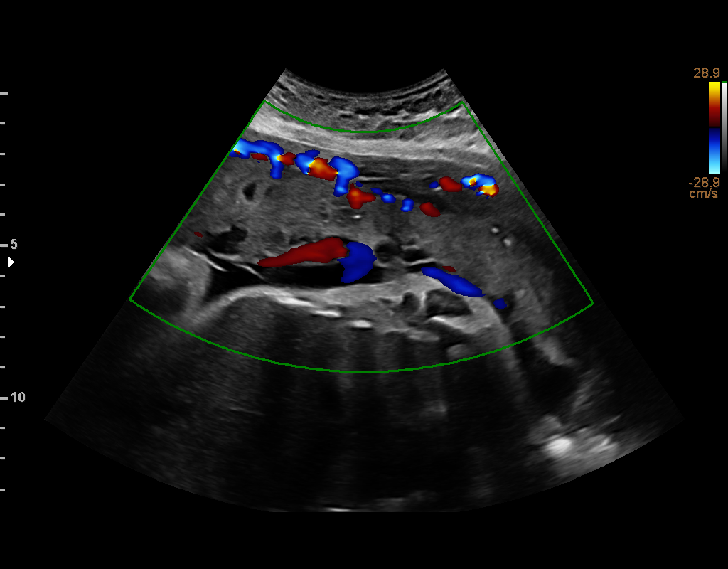
[im 24/24]
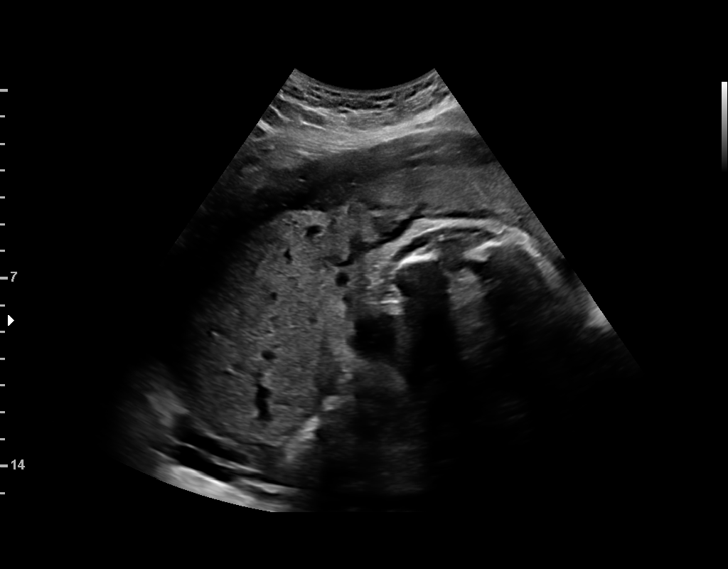

[14 of 24 positions shown; findings below may reference images not displayed]

1  FRAN DAVALOS            [id number]      [id number]     [id number]
Indications

34 weeks gestation of pregnancy
Abdominal pain in pregnancy

OB History

Gravidity:    3         Term:   0        Prem:   0        SAB:   2
TOP:          0       Ectopic:  0        Living: 0
Fetal Evaluation

Num Of Fetuses:     1
Fetal Heart         135
Rate(bpm):
Cardiac Activity:   Observed
Placenta:           Anterior, above cervical os
P. Cord Insertion:  Visualized, central

Amniotic Fluid
AFI FV:      Subjectively within normal limits

AFI Sum(cm)     %Tile
12.91           40

RUQ(cm)       RLQ(cm)       LUQ(cm)        LLQ(cm)
1.11
Gestational Age

LMP:           35w 3d        Date:  05/06/15                 EDD:   02/10/16
Best:          34w 0d     Det. By:  Early Ultrasound         EDD:   02/20/16
(06/25/15)
Impression

Single IUP at 34w 0d
Limited ultrasound performed due to abdominal pain and
vaginal spotting
Anterior placenta without previa
No obvious ultrasound findings suggestive of abruption
Normal amniotic fluid volume
Recommendations

Placental abruption is a clinical diagnosis - the sensitivity of
ultrasound for the diagnosis of abruption is limited
Follow-up ultrasounds as clinically indicated.

## 2018-07-19 ENCOUNTER — Other Ambulatory Visit: Payer: Self-pay

## 2018-07-19 ENCOUNTER — Encounter (HOSPITAL_COMMUNITY): Payer: Self-pay | Admitting: Emergency Medicine

## 2018-07-19 ENCOUNTER — Emergency Department (HOSPITAL_COMMUNITY)
Admission: EM | Admit: 2018-07-19 | Discharge: 2018-07-19 | Disposition: A | Discharge status: Home / Self Care | Payer: 59 | Attending: Emergency Medicine | Admitting: Emergency Medicine

## 2018-07-19 DIAGNOSIS — Z20828 Contact with and (suspected) exposure to other viral communicable diseases: Secondary | ICD-10-CM | POA: Insufficient documentation

## 2018-07-19 DIAGNOSIS — Z79899 Other long term (current) drug therapy: Secondary | ICD-10-CM | POA: Insufficient documentation

## 2018-07-19 DIAGNOSIS — R0602 Shortness of breath: Secondary | ICD-10-CM | POA: Diagnosis present

## 2018-07-19 DIAGNOSIS — B349 Viral infection, unspecified: Secondary | ICD-10-CM | POA: Diagnosis not present

## 2018-07-19 LAB — POC URINE PREG, ED: Preg Test, Ur: NEGATIVE

## 2018-07-19 MED ORDER — ONDANSETRON 4 MG PO TBDP
4.0000 mg | ORAL_TABLET | Freq: Once | ORAL | Status: AC
  Administered 2018-07-19: 4 mg via ORAL
  Filled 2018-07-19: qty 1

## 2018-07-19 NOTE — ED Provider Notes (Signed)
[facility] EMERGENCY DEPARTMENT Provider Note   CSN: [id number] Arrival date & time: 07/19/18  1313    History   Chief Complaint Chief Complaint  Patient presents with  . Generalized Body Aches  . Shortness of Breath    HPI Courtney Mitchell is a 23 y.o. female.     HPI   23 year old female presents today with generalized fatigue.  Patient notes that for approximately 5 days she has had generalized body aches, fatigue, chills.  She notes that she has not had any cough but also reports some shortness of breath.  She denies any chest pain lower extremity swelling or edema, history of PE or any significant risk factors for the same.  She notes yesterday she did have some discomfort in her chest, none today.  She notes that her boyfriend is experiencing similar symptoms.  She denies any close sick contacts.    Past Medical History:  Diagnosis Date  . Anemia   . Heart murmur 03/01/2015  . Menorrhagia 08/03/2011  . Trichomoniasis     Patient Active Problem List   Diagnosis Date Noted  . BMI 31.0-31.9,adult 07/24/2015  . Heart murmur 03/01/2015  . 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 DILATION AND EVACUATION N/A 03/03/2015   Procedure: DILATATION AND EVACUATION;  Surgeon: Delsa Bern, MD;  Location: [facility];  Service: Gynecology;  Laterality: N/A;  . DILATION AND EVACUATION N/A 04/12/2015   Procedure: DILATATION AND EVACUATION;  Surgeon: Everett Graff, MD;  Location: [facility];  Service: Gynecology;  Laterality: N/A;  . WISDOM TOOTH EXTRACTION       OB History    Gravida  3   Para  1   Term  1   Preterm  0   AB  2   Living  1     SAB  2   TAB  0   Ectopic  0   Multiple  0   Live Births  1            Home Medications    Prior to Admission medications   Medication Sig Start Date End Date Taking? Authorizing Provider  diphenhydrAMINE (BENADRYL) 25 mg capsule TAKE 1-2 CAPSULES  BY MOUTH EVERY 6 HOURS AS NEEDED FOR SLEEP OR (RHINORRHEA) 12/24/15   [provider]  ibuprofen (ADVIL,MOTRIN) 600 MG tablet Take 600 mg by mouth every 6 (six) hours as needed. 02/19/16   [provider]  prenatal vitamin w/FE, FA (NATACHEW) 29-1 MG CHEW chewable tablet Chew 3 tablets by mouth daily at 12 noon.    [provider]    Family History Family History  Problem Relation Age of Onset  . Cancer Other   . Asthma Father   . Hypertension Father     Social History Social History   Tobacco Use  . Smoking status: Never Smoker  . Smokeless tobacco: Never Used  Substance Use Topics  . Alcohol use: No  . Drug use: No     Allergies   Patient has no known allergies.   Review of Systems Review of Systems  All other systems reviewed and are negative.    Physical Exam Updated Vital Signs BP 128/84 (BP Location: Right Arm)   Pulse 79   Temp 98.2 F (36.8 C) (Oral)   Resp 16   LMP 07/19/2018   SpO2 98%   Physical Exam Vitals signs and nursing note reviewed.  Constitutional:  Appearance: She is well-developed.  HENT:     Head: Normocephalic and atraumatic.  Eyes:     General: No scleral icterus.       Right eye: No discharge.        Left eye: No discharge.     Conjunctiva/sclera: Conjunctivae normal.     Pupils: Pupils are equal, round, and reactive to light.  Neck:     Musculoskeletal: Normal range of motion.     Vascular: No JVD.     Trachea: No tracheal deviation.  Cardiovascular:     Rate and Rhythm: Normal rate and regular rhythm.  Pulmonary:     Effort: Pulmonary effort is normal. No respiratory distress.     Breath sounds: Normal breath sounds. No stridor. No wheezing or rhonchi.  Musculoskeletal:     Comments: No swelling to the lower extremities  Neurological:     Mental Status: She is alert and oriented to person, place, and time.     Coordination: Coordination normal.  Psychiatric:        Behavior: Behavior  normal.        Thought Content: Thought content normal.        Judgment: Judgment normal.      ED Treatments / Results  Labs (all labs ordered are listed, but only abnormal results are displayed) Labs Reviewed  NOVEL CORONAVIRUS, NAA (HOSPITAL ORDER, SEND-OUT TO REF LAB)  POC URINE PREG, ED    EKG None  Radiology No results found.  Procedures Procedures (including critical care time)  Medications Ordered in ED Medications  ondansetron (ZOFRAN-ODT) disintegrating tablet 4 mg (4 mg Oral Given 07/19/18 1429)     Initial Impression / Assessment and Plan / ED Course  I have reviewed the triage vital signs and the nursing notes.  Pertinent labs & imaging results that were available during my care of the patient were reviewed by me and considered in my medical decision making (see chart for details).        Labs: Covid 19   Imaging:  Consults:  Therapeutics:  Discharge Meds:   Assessment/Plan: 23 year old female presents today with likely viral illness.  She is well-appearing in no acute distress.  She does have shortness of breath, she has clear lung sounds reassuring vital signs.  No signs of PE, negative d-dimer, Wells 0.  Patient will have COVID testing discharge with symptomatic care and strict return precautions.  She verbalized understanding and agreement to today's plan had no further questions or concerns at time discharge.    Final Clinical Impressions(s) / ED Diagnoses   Final diagnoses:  Viral illness    ED Discharge Orders    None       Rosalio LoudHedges, Franziska Podgurski, PA-C 07/19/18 1508    Long, Arlyss RepressJoshua G, MD 07/20/18 [phone]

## 2018-07-19 NOTE — ED Triage Notes (Signed)
Pt reports since Friday she has been having generalized body aches and fatigue with chills. Pt states no known fever or covid exposure. Pt states she has also felt short of breath and that concerned her. Pt denies any CP or cough. Pt is alert and ox4, no acute distress.

## 2018-07-19 NOTE — Discharge Instructions (Addendum)
Please read attached information. If you experience any new or worsening signs or symptoms please return to the emergency room for evaluation. Please follow-up with your primary care provider or specialist as discussed.  °

## 2018-07-20 LAB — NOVEL CORONAVIRUS, NAA (HOSP ORDER, SEND-OUT TO REF LAB; TAT 18-24 HRS): SARS-CoV-2, NAA: NOT DETECTED

## 2018-10-20 ENCOUNTER — Other Ambulatory Visit: Payer: Self-pay

## 2018-10-20 ENCOUNTER — Inpatient Hospital Stay (HOSPITAL_COMMUNITY)
Admission: AD | Admit: 2018-10-20 | Discharge: 2018-10-20 | Disposition: A | Discharge status: Home / Self Care | Payer: 59 | Attending: Obstetrics and Gynecology | Admitting: Obstetrics and Gynecology

## 2018-10-20 DIAGNOSIS — Z3202 Encounter for pregnancy test, result negative: Secondary | ICD-10-CM | POA: Diagnosis not present

## 2018-10-20 DIAGNOSIS — O209 Hemorrhage in early pregnancy, unspecified: Secondary | ICD-10-CM | POA: Insufficient documentation

## 2018-10-20 DIAGNOSIS — Y92410 Unspecified street and highway as the place of occurrence of the external cause: Secondary | ICD-10-CM | POA: Insufficient documentation

## 2018-10-20 DIAGNOSIS — N939 Abnormal uterine and vaginal bleeding, unspecified: Secondary | ICD-10-CM | POA: Diagnosis not present

## 2018-10-20 DIAGNOSIS — O26891 Other specified pregnancy related conditions, first trimester: Secondary | ICD-10-CM | POA: Insufficient documentation

## 2018-10-20 DIAGNOSIS — R52 Pain, unspecified: Secondary | ICD-10-CM | POA: Insufficient documentation

## 2018-10-20 DIAGNOSIS — Z3A01 Less than 8 weeks gestation of pregnancy: Secondary | ICD-10-CM | POA: Diagnosis not present

## 2018-10-20 LAB — HCG, QUANTITATIVE, PREGNANCY: hCG, Beta Chain, Quant, S: <1 m[IU]/mL (ref <5)

## 2018-10-20 LAB — POCT PREGNANCY, URINE: Preg Test, Ur: NEGATIVE

## 2018-10-20 NOTE — ED Provider Notes (Addendum)
Pt was assigned bed in adult ED. However she later mentioned to triage staff that she might be pregnant and was transported to MAU. I did not personally evaluate the patient prior to MAU transport   Cherre Robins, PA-C 10/20/18 1526    Demarqus Jocson, [place], PA-C 10/20/18 1527    Gareth Morgan, MD 10/21/18 [phone]
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
## 2018-10-20 NOTE — Discharge Instructions (Signed)
-  return to [facility] ED if saturating more than 2 pads an hour for two hours. -Return to [facility] ED if you have dizziness, lightheaded, severe abdominal pain that goes up to a 10 and stays there  -wait until your next period before trying to conceive again. -Take 800 mg of ibuprofen every 8 hours

## 2019-09-27 ENCOUNTER — Other Ambulatory Visit: Payer: Self-pay

## 2019-09-27 ENCOUNTER — Encounter (HOSPITAL_COMMUNITY): Payer: Self-pay

## 2019-09-27 ENCOUNTER — Emergency Department (HOSPITAL_COMMUNITY)
Admission: EM | Admit: 2019-09-27 | Discharge: 2019-09-27 | Disposition: A | Discharge status: Home / Self Care | Payer: 59 | Attending: Emergency Medicine | Admitting: Emergency Medicine

## 2019-09-27 DIAGNOSIS — R0981 Nasal congestion: Secondary | ICD-10-CM | POA: Insufficient documentation

## 2019-09-27 DIAGNOSIS — J069 Acute upper respiratory infection, unspecified: Secondary | ICD-10-CM | POA: Insufficient documentation

## 2019-09-27 DIAGNOSIS — M791 Myalgia, unspecified site: Secondary | ICD-10-CM | POA: Insufficient documentation

## 2019-09-27 DIAGNOSIS — Z20822 Contact with and (suspected) exposure to covid-19: Secondary | ICD-10-CM | POA: Insufficient documentation

## 2019-09-27 LAB — RESPIRATORY PANEL BY RT PCR (FLU A&B, COVID)
Influenza A by PCR: NEGATIVE
Influenza B by PCR: NEGATIVE
SARS Coronavirus 2 by RT PCR: NEGATIVE

## 2019-09-27 NOTE — Discharge Instructions (Addendum)
Please return for any problem.   Follow up with your regular care provider as instructed.   Check your Covid Test result in the MyChart system later this evening.

## 2019-09-27 NOTE — ED Provider Notes (Signed)
[facility]-EMERGENCY DEPT Provider Note   CSN: [id number] Arrival date & time: 09/27/19  1016     History Chief Complaint  Patient presents with  . Chills  . Fatigue  . Covid Exposure    Courtney Mitchell is a 24 y.o. female.  24 year old female with prior medical history as detailed below presents for evaluation.  Patient reports subjective fever, chills, body aches, and congestion over the last 24 hours.  Patient reports that her husband was working with another individual who tested positive for Covid.  Her husband does not have symptoms.  She request Covid testing.  She is not vaccinated for Covid.  She denies shortness of breath.  She denies cough.  She denies loss of smell.  The history is provided by the patient and medical records.  Illness Location:  Concern re covid infection Severity:  Mild Onset quality:  Gradual Duration:  1 day Timing:  Constant Progression:  Unchanged Chronicity:  New Associated symptoms: congestion and fever   Associated symptoms: no shortness of breath        Past Medical History:  Diagnosis Date  . Anemia   . Heart murmur 03/01/2015  . Menorrhagia 08/03/2011  . Trichomoniasis     Patient Active Problem List   Diagnosis Date Noted  . BMI 31.0-31.9,adult 07/24/2015  . Heart murmur 03/01/2015  . 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 DILATION AND EVACUATION N/A 03/03/2015   Procedure: DILATATION AND EVACUATION;  Surgeon: Silverio Lay, MD;  Location: [facility];  Service: Gynecology;  Laterality: N/A;  . DILATION AND EVACUATION N/A 04/12/2015   Procedure: DILATATION AND EVACUATION;  Surgeon: Osborn Coho, MD;  Location: [facility];  Service: Gynecology;  Laterality: N/A;  . WISDOM TOOTH EXTRACTION       OB History    Gravida  3   Para  1   Term  1   Preterm  0   AB  2   Living  1     SAB  2   TAB  0   Ectopic  0   Multiple  0   Live Births    1           Family History  Problem Relation Age of Onset  . Cancer Other   . Asthma Father   . Hypertension Father     Social History   Tobacco Use  . Smoking status: Never Smoker  . Smokeless tobacco: Never Used  Substance Use Topics  . Alcohol use: No  . Drug use: No    Home Medications Prior to Admission medications   Medication Sig Start Date End Date Taking? Authorizing Provider  prenatal vitamin w/FE, FA (NATACHEW) 29-1 MG CHEW chewable tablet Chew 3 tablets by mouth daily at 12 noon.    [provider]    Allergies    Patient has no known allergies.  Review of Systems   Review of Systems  Constitutional: Positive for fever.  HENT: Positive for congestion.   Respiratory: Negative for shortness of breath.   All other systems reviewed and are negative.   Physical Exam Updated Vital Signs BP 132/86 (BP Location: Left Arm)   Pulse (!) 102   Temp 99.1 F (37.3 C) (Oral)   Resp 18   LMP 09/06/2019   SpO2 94%   Physical Exam Vitals and nursing note reviewed.  Constitutional:      General: She is not in acute  distress.    Appearance: Normal appearance. She is well-developed.  HENT:     Head: Normocephalic and atraumatic.  Eyes:     Conjunctiva/sclera: Conjunctivae normal.     Pupils: Pupils are equal, round, and reactive to light.  Cardiovascular:     Rate and Rhythm: Normal rate and regular rhythm.     Heart sounds: Normal heart sounds.  Pulmonary:     Effort: Pulmonary effort is normal. No respiratory distress.     Breath sounds: Normal breath sounds.  Abdominal:     General: There is no distension.     Palpations: Abdomen is soft.     Tenderness: There is no abdominal tenderness.  Musculoskeletal:        General: No deformity. Normal range of motion.     Cervical back: Normal range of motion and neck supple.  Skin:    General: Skin is warm and dry.  Neurological:     General: No focal deficit present.     Mental Status: She is  alert and oriented to person, place, and time. Mental status is at baseline.     ED Results / Procedures / Treatments   Labs (all labs ordered are listed, but only abnormal results are displayed) Labs Reviewed  RESPIRATORY PANEL BY RT PCR (FLU A&B, COVID)    EKG None  Radiology No results found.  Procedures Procedures (including critical care time)  Medications Ordered in ED Medications - No data to display  ED Course  I have reviewed the triage vital signs and the nursing notes.  Pertinent labs & imaging results that were available during my care of the patient were reviewed by me and considered in my medical decision making (see chart for details).    MDM Rules/Calculators/A&P                          MDM  Screen complete  Courtney Mitchell was evaluated in Emergency Department on 09/27/2019 for the symptoms described in the history of present illness. She was evaluated in the context of the global COVID-19 pandemic, which necessitated consideration that the patient might be at risk for infection with the SARS-CoV-2 virus that causes COVID-19. Institutional protocols and algorithms that pertain to the evaluation of patients at risk for COVID-19 are in a state of rapid change based on information released by regulatory bodies including the CDC and federal and state organizations. These policies and algorithms were followed during the patient's care in the ED.  Patient is presenting for evaluation of viral uri symptoms.   Covid Testing initiated from the ED. The patient is comfortable following up her result through MyChart.   Patient otherwise appears to be appropriate for discharge.  She does understand the need for close follow-up.  Strict return cautions given and understood.  If patient's Covid test is positive she does understand the need for quarantine.    Final Clinical Impression(s) / ED Diagnoses Final diagnoses:  Viral URI    Rx / DC Orders ED Discharge  Orders    None       Wynetta Fines, MD 09/27/19 1357

## 2019-09-27 NOTE — ED Triage Notes (Signed)
Pt presents with c/o chills, body aches, fatigue, sore throat. Pt reports these symptoms started yesterday. Pt has had an exposure to her husband who had an exposure to someone who was positive for Covid.

## 2019-11-10 ENCOUNTER — Other Ambulatory Visit: Payer: Self-pay

## 2019-11-10 ENCOUNTER — Encounter (HOSPITAL_COMMUNITY): Payer: Self-pay

## 2019-11-10 ENCOUNTER — Emergency Department (HOSPITAL_COMMUNITY)
Admission: EM | Admit: 2019-11-10 | Discharge: 2019-11-10 | Disposition: A | Discharge status: Home / Self Care | Payer: Self-pay | Attending: Emergency Medicine | Admitting: Emergency Medicine

## 2019-11-10 DIAGNOSIS — L270 Generalized skin eruption due to drugs and medicaments taken internally: Secondary | ICD-10-CM | POA: Insufficient documentation

## 2019-11-10 MED ORDER — IBUPROFEN 800 MG PO TABS: 800.0000 mg | ORAL_TABLET | Freq: Three times a day (TID) | ORAL | 0 refills | Status: DC

## 2019-11-10 NOTE — ED Provider Notes (Signed)
Courtney Mitchell   CSN: [id number] Arrival date & time: 11/10/19  0753     History Chief Complaint  Patient presents with  . Post-op Problem    Courtney Mitchell is a 24 y.o. female.  24 year old female with prior medical history detailed below presents for evaluation of reported rash.  Patient reports liposuction with injection of antibiotics that occurred in [place].  This procedure occurred on 10/22.  Patient reports that the immediate postoperative period was fine.  She was placed on clindamycin.   Patient is presenting complaining of diffuse red rash to both upper and lower extremities.  Rash developed over the last week.  Patient reports that she stopped the clindamycin as the rash began.  She denies prior history of drug rash or reaction.  She denies recent fever.  She denies oral lesions or difficulty swallowing.  She denies irritation or redness around the liposuction sites itself.    The history is provided by the patient and medical records.  Rash Location: Both upper and lower extremities. Quality: redness   Severity:  Mild Onset quality:  Gradual Duration:  1 week Timing:  Constant Progression:  Unchanged Chronicity:  New Relieved by:  Nothing Worsened by:  Nothing      Past Medical History:  Diagnosis Date  . Anemia   . Heart murmur 03/01/2015  . Menorrhagia 08/03/2011  . Trichomoniasis     Patient Active Problem List   Diagnosis Date Noted  . BMI 31.0-31.9,adult 07/24/2015  . Heart murmur 03/01/2015  . 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 DILATION AND EVACUATION N/A 03/03/2015   Procedure: DILATATION AND EVACUATION;  Surgeon: Silverio Lay, MD;  Location: [facility];  Service: Gynecology;  Laterality: N/A;  . DILATION AND EVACUATION N/A 04/12/2015   Procedure: DILATATION AND EVACUATION;  Surgeon: Osborn Coho, MD;  Location: [facility];  Service:  Gynecology;  Laterality: N/A;  . LIPOSUCTION    . WISDOM TOOTH EXTRACTION       OB History    Gravida  3   Para  1   Term  1   Preterm  0   AB  2   Living  1     SAB  2   TAB  0   Ectopic  0   Multiple  0   Live Births  1           Family History  Problem Relation Age of Onset  . Cancer Other   . Asthma Father   . Hypertension Father     Social History   Tobacco Use  . Smoking status: Never Smoker  . Smokeless tobacco: Never Used  Substance Use Topics  . Alcohol use: No  . Drug use: No    Home Medications Prior to Admission medications   Medication Sig Start Date End Date Taking? Authorizing Provider  Ascorbic Acid (VITAMIN C PO) Take 1 tablet by mouth daily.    [provider]  Ferrous Sulfate (IRON PO) Take 1 tablet by mouth daily.    [provider]  ibuprofen (ADVIL) 800 MG tablet Take 1 tablet (800 mg total) by mouth 3 (three) times daily. 11/10/19   Wynetta Fines, MD  Probiotic Product (PROBIOTIC PO) Take 1 capsule by mouth daily.    [provider]    Allergies    Patient has no known allergies.  Review of Systems   Review of  Systems  Skin: Positive for rash.  All other systems reviewed and are negative.   Physical Exam Updated Vital Signs BP 129/66 (BP Location: Right Arm)   Pulse (!) 120   Temp 98.6 F (37 C) (Oral)   Resp 18   LMP 11/02/2019   SpO2 99%   Physical Exam Vitals and nursing Mitchell reviewed.  Constitutional:      General: She is not in acute distress.    Appearance: Normal appearance. She is well-developed.  HENT:     Head: Normocephalic and atraumatic.  Eyes:     Conjunctiva/sclera: Conjunctivae normal.     Pupils: Pupils are equal, round, and reactive to light.  Cardiovascular:     Rate and Rhythm: Normal rate and regular rhythm.     Heart sounds: Normal heart sounds.  Pulmonary:     Effort: Pulmonary effort is normal. No respiratory distress.     Breath sounds: Normal  breath sounds.  Abdominal:     General: There is no distension.     Palpations: Abdomen is soft.     Tenderness: There is no abdominal tenderness.  Musculoskeletal:        General: No deformity. Normal range of motion.     Cervical back: Normal range of motion and neck supple.  Skin:    General: Skin is warm and dry.     Comments: Diffuse rash noted to both upper and lower extremities.  See images below.   Rash is nonblanching.  There is no tenderness appreciated with palpation.  Neurological:     Mental Status: She is alert and oriented to person, place, and time.           ED Results / Procedures / Treatments   Labs (all labs ordered are listed, but only abnormal results are displayed) Labs Reviewed - No data to display  EKG None  Radiology No results found.  Procedures Procedures (including critical care time)  Medications Ordered in ED Medications - No data to display  ED Course  I have reviewed the triage vital signs and the nursing notes.  Pertinent labs & imaging results that were available during my care of the patient were reviewed by me and considered in my medical decision making (see chart for details).    MDM Rules/Calculators/A&P                          MDM  Screen complete  Courtney Mitchell was evaluated in Emergency Department on 11/10/2019 for the symptoms described in the history of present illness. She was evaluated in the context of the global COVID-19 pandemic, which necessitated consideration that the patient might be at risk for infection with the SARS-CoV-2 virus that causes COVID-19. Institutional protocols and algorithms that pertain to the evaluation of patients at risk for COVID-19 are in a state of rapid change based on information released by regulatory bodies including the CDC and federal and state organizations. These policies and algorithms were followed during the patient's care in the ED.  Patient is presenting for evaluation of  rash.  Patient does report recent use of clindamycin for the first time.  Her presentation and exam are consistent with likely drug reaction (most likely from clindamycin).  Patient has already halted her clindamycin.  Patient is advised that she should continue to avoid clindamycin in the future.  She is advised to use Benadryl for itching.  Given patient's recent surgery I do not feel that a  course of steroids would be beneficial enough to justify the risk of possible postoperative infection.  Patient is advised attempted follow-up with her surgeons in [place].  Importance of close follow-up is stressed.  Strict return precautions given and understood. Final Clinical Impression(s) / ED Diagnoses Final diagnoses:  Drug rash    Rx / DC Orders ED Discharge Orders         Ordered    ibuprofen (ADVIL) 800 MG tablet  3 times daily        11/10/19 0856           Wynetta Fines, MD 11/10/19 [phone]

## 2019-11-10 NOTE — Discharge Instructions (Signed)
Please return for any problem.   Use Benadryl for itch as instructed.   Return specifically for fever, oral sores, difficulty breathing, or other emergency.

## 2019-11-10 NOTE — ED Triage Notes (Signed)
Pt arrived via walk in, c/o bilateral leg swelling and rash and bilat. Hand swelling and pain. States she recently had liposuction (10/26/19) and after finishing abx has had these sx, worsening x1 week.

## 2020-03-18 ENCOUNTER — Inpatient Hospital Stay (HOSPITAL_COMMUNITY)
Admission: AD | Admit: 2020-03-18 | Discharge: 2020-03-18 | Discharge status: Against Medical Advice | Payer: Self-pay | Attending: Obstetrics and Gynecology | Admitting: Obstetrics and Gynecology

## 2020-03-18 ENCOUNTER — Other Ambulatory Visit: Payer: Self-pay

## 2020-03-18 DIAGNOSIS — Z3202 Encounter for pregnancy test, result negative: Secondary | ICD-10-CM

## 2020-03-18 DIAGNOSIS — Z5321 Procedure and treatment not carried out due to patient leaving prior to being seen by health care provider: Secondary | ICD-10-CM | POA: Insufficient documentation

## 2020-03-18 LAB — CBC
HCT: 37.5 % (ref 36.0–46.0)
Hemoglobin: 11.7 g/dL — ABNORMAL LOW (ref 12.0–15.0)
MCH: 25.4 pg — ABNORMAL LOW (ref 26.0–34.0)
MCHC: 31.2 g/dL (ref 30.0–36.0)
MCV: 81.3 fL (ref 80.0–100.0)
Platelets: 415 10*3/uL — ABNORMAL HIGH (ref 150–400)
RBC: 4.61 MIL/uL (ref 3.87–5.11)
RDW: 16.7 % — ABNORMAL HIGH (ref 11.5–15.5)
WBC: 8.1 10*3/uL (ref 4.0–10.5)
nRBC: 0 % (ref 0.0–0.2)

## 2020-03-18 LAB — HCG, QUANTITATIVE, PREGNANCY: hCG, Beta Chain, Quant, S: 1 m[IU]/mL (ref <5)

## 2020-03-18 LAB — POCT PREGNANCY, URINE: Preg Test, Ur: NEGATIVE

## 2020-03-18 NOTE — MAU Note (Signed)
Presents with c/o heavy VB that began 2 weeks ago, soaking through 2 tampons per hour. Has IUD in place, +HPT 03/04/20.

## 2020-03-18 NOTE — MAU Note (Signed)
MAU provider called for pt from the waiting room for the third time and pt did not appear when called. RN removed pt from MAU board.

## 2020-03-18 NOTE — MAU Provider Note (Signed)
Event Date/Time   First Provider Initiated Contact with Patient 03/18/20 1800      S Ms. Courtney Mitchell is a 25 y.o. [phone] patient who presents to MAU today with complaint of heavy vaginal bleeding and positive home UPT. Patient reports using 1-2 tampons and pads per hour for the past few days. Patient reports she has had a Paragard IUD for about 3 years and would like it removed today. Patient also reports +UPT at home 03/04/2020.  O BP 139/83 (BP Location: Right Arm)   Pulse 84   Temp 98.1 F (36.7 C) (Oral)   Resp 18   Ht 5\' 5"  (1.651 m)   Wt 90.6 kg   LMP 03/04/2020   SpO2 99%   BMI 33.23 kg/m    Patient Vitals for the past 24 hrs:  BP Temp Temp src Pulse Resp SpO2 Height Weight  03/18/20 1751 139/83 98.1 F (36.7 C) Oral 84 18 99 % -- --  03/18/20 1747 -- -- -- -- -- -- 5\' 5"  (1.651 m) 90.6 kg   Physical Exam Vitals and nursing note reviewed.  Constitutional:      Appearance: Normal appearance.  HENT:     Head: Normocephalic and atraumatic.  Pulmonary:     Effort: Pulmonary effort is normal.  Neurological:     Mental Status: She is alert and oriented to person, place, and time.  Psychiatric:        Mood and Affect: Mood normal.        Behavior: Behavior normal.        Thought Content: Thought content normal.        Judgment: Judgment normal.    A Medical screening exam started UPT negative Pt advised that since she just changed her pad she should wait about 2 hours and provider will reevaluate her pad once test results are in. If she feels she needs to change her pad sooner, she should notify the front desk who will notify the provider so bleeding can be evaluated.  P Patient left AMA prior to having pad checked and prior to test results review Patient called from waiting room x3, no response Patient called on phone, no response, message not left Patient has MyChart, sent MyChart message with test results and OB/GYN providers for f/u  03/20/20,  NP 03/18/2020 8:45 PM

## 2020-10-11 ENCOUNTER — Other Ambulatory Visit: Payer: Self-pay | Admitting: Nurse Practitioner

## 2021-07-30 ENCOUNTER — Other Ambulatory Visit: Payer: Self-pay | Admitting: Orthopedic Surgery

## 2021-07-30 ENCOUNTER — Other Ambulatory Visit (HOSPITAL_COMMUNITY): Payer: Self-pay | Admitting: Orthopedic Surgery

## 2021-07-30 DIAGNOSIS — M25512 Pain in left shoulder: Secondary | ICD-10-CM

## 2021-08-14 ENCOUNTER — Encounter (HOSPITAL_COMMUNITY): Payer: Self-pay

## 2021-08-14 ENCOUNTER — Ambulatory Visit (HOSPITAL_COMMUNITY)
Admission: RE | Admit: 2021-08-14 | Discharge: 2021-08-14 | Disposition: A | Discharge status: Home / Self Care | Payer: 59 | Source: Ambulatory Visit | Attending: Orthopedic Surgery | Admitting: Orthopedic Surgery

## 2021-08-14 DIAGNOSIS — M25512 Pain in left shoulder: Secondary | ICD-10-CM

## 2021-08-14 DIAGNOSIS — M75112 Incomplete rotator cuff tear or rupture of left shoulder, not specified as traumatic: Secondary | ICD-10-CM | POA: Diagnosis not present

## 2021-08-14 DIAGNOSIS — S42302D Unspecified fracture of shaft of humerus, left arm, subsequent encounter for fracture with routine healing: Secondary | ICD-10-CM | POA: Diagnosis not present

## 2021-08-14 MED ORDER — LIDOCAINE HCL (PF) 1 % IJ SOLN
INTRAMUSCULAR | Status: AC
  Filled 2021-08-14: qty 10

## 2021-08-14 MED ORDER — SODIUM CHLORIDE (PF) 0.9 % IJ SOLN
INTRAMUSCULAR | Status: AC
  Filled 2021-08-14: qty 10

## 2021-08-14 MED ORDER — IOHEXOL 300 MG/ML  SOLN
10.0000 mL | Freq: Once | INTRAMUSCULAR | Status: AC | PRN
  Administered 2021-08-14: 10 mL via INTRA_ARTICULAR

## 2021-08-14 MED ORDER — LIDOCAINE HCL (PF) 1 % IJ SOLN
7.5000 mL | Freq: Once | INTRAMUSCULAR | Status: AC
  Administered 2021-08-14: 7.5 mL via INTRADERMAL

## 2021-08-14 MED ORDER — SODIUM CHLORIDE (PF) 0.9 % IJ SOLN
10.0000 mL | INTRAMUSCULAR | Status: AC
  Administered 2021-08-14: 10 mL

## 2022-03-03 ENCOUNTER — Other Ambulatory Visit: Payer: Self-pay | Admitting: Orthopedic Surgery

## 2022-03-22 NOTE — Progress Notes (Signed)
PCP - [facility] Cardiologist - n/a  PPM/ICD - Denies  Chest x-ray - n/a EKG - 07/20/2018 Stress Test -  ECHO - 02/21/2014 Cardiac Cath -   CPAP - Denies  Non-diabetic  Blood Thinner Instructions: Denies Aspirin Instructions: Denies  ERAS Protcol - Yes, Clear liquid until 3 hrs prior to surgery  COVID TEST- Denies  Anesthesia review: No  Patient verbally denies any shortness of breath, fever, cough and chest pain during phone call   -------------  SDW INSTRUCTIONS given:  Your procedure is scheduled on Wednesday March 24, 2022.  Report to [facility] Main Entrance "A" at 1015 A.M., and check in at the Admitting office.  Call this number if you have problems the morning of surgery:  [phone]   Remember:  Do not eat after midnight the night before your surgery  You may drink clear liquids until 0945 the morning of your surgery.   Clear liquids allowed are: Water, Non-Citrus Juices (without pulp), Carbonated Beverages, Clear Tea, Black Coffee Only, and Gatorade    Take these medicines the morning of surgery with A SIP OF WATER: None   As of today, STOP taking any Aspirin (unless otherwise instructed by your surgeon) Aleve, Naproxen, Ibuprofen, Motrin, Advil, Goody's, BC's, all herbal medications, fish oil, and all vitamins.           Do NOT Smoke (Tobacco/Vaping) 24 hours prior to your procedure  If you use a CPAP at night, you may bring all equipment for your overnight stay.   Contacts, glasses, dentures or bridgework may not be worn into surgery.      For patients admitted to the hospital, discharge time will be determined by your treatment team.   Patients discharged the day of surgery will not be allowed to drive home, and someone needs to stay with them for 24 hours.    Special instructions:   [facility]- Preparing For Surgery  Before surgery, you can play an important role. Because skin is not sterile, your skin needs to be as  free of germs as possible. You can reduce the number of germs on your skin by washing with Dial Soap before surgery.    Oral Hygiene is also important to reduce your risk of infection.  Remember - BRUSH YOUR TEETH THE MORNING OF SURGERY WITH YOUR REGULAR TOOTHPASTE  Please follow these instructions carefully.   Shower the NIGHT BEFORE SURGERY and the MORNING OF SURGERY with DIAL Soap.   Pat yourself dry with a CLEAN TOWEL.  Wear CLEAN PAJAMAS to bed the night before surgery  Place CLEAN SHEETS on your bed the night of your first shower and DO NOT SLEEP WITH PETS.   Day of Surgery: Please shower morning of surgery  Wear Clean/Comfortable clothing the morning of surgery Do not apply any deodorants/lotions, powders, jewelry, make up or nail polish.   Remember to brush your teeth WITH YOUR REGULAR TOOTHPASTE.  Do not bring valuables to the hospital. [facility] is not responsible for any belongings or valuables.    Questions were answered. Patient verbalized understanding of instructions.

## 2022-03-23 ENCOUNTER — Encounter (HOSPITAL_COMMUNITY): Payer: Self-pay | Admitting: Orthopedic Surgery

## 2022-03-23 ENCOUNTER — Other Ambulatory Visit: Payer: Self-pay

## 2022-03-24 ENCOUNTER — Other Ambulatory Visit: Payer: Self-pay

## 2022-03-24 ENCOUNTER — Ambulatory Visit (HOSPITAL_COMMUNITY)
Admission: RE | Admit: 2022-03-24 | Discharge: 2022-03-24 | Disposition: A | Discharge status: Home / Self Care | Payer: 59 | Attending: Orthopedic Surgery | Admitting: Orthopedic Surgery

## 2022-03-24 ENCOUNTER — Ambulatory Visit (HOSPITAL_BASED_OUTPATIENT_CLINIC_OR_DEPARTMENT_OTHER): Payer: 59

## 2022-03-24 ENCOUNTER — Encounter (HOSPITAL_COMMUNITY): Payer: Self-pay | Admitting: Orthopedic Surgery

## 2022-03-24 ENCOUNTER — Ambulatory Visit (HOSPITAL_COMMUNITY): Payer: 59

## 2022-03-24 ENCOUNTER — Encounter (HOSPITAL_COMMUNITY)
Admission: RE | Disposition: A | Discharge status: Home / Self Care | Payer: Self-pay | Source: Home / Self Care | Attending: Orthopedic Surgery

## 2022-03-24 DIAGNOSIS — E669 Obesity, unspecified: Secondary | ICD-10-CM | POA: Diagnosis not present

## 2022-03-24 DIAGNOSIS — Z6831 Body mass index (BMI) 31.0-31.9, adult: Secondary | ICD-10-CM | POA: Insufficient documentation

## 2022-03-24 DIAGNOSIS — T8484XA Pain due to internal orthopedic prosthetic devices, implants and grafts, initial encounter: Secondary | ICD-10-CM | POA: Insufficient documentation

## 2022-03-24 DIAGNOSIS — Y831 Surgical operation with implant of artificial internal device as the cause of abnormal reaction of the patient, or of later complication, without mention of misadventure at the time of the procedure: Secondary | ICD-10-CM | POA: Diagnosis not present

## 2022-03-24 HISTORY — PX: HARDWARE REMOVAL: SHX979

## 2022-03-24 LAB — CBC
HCT: 35.6 % — ABNORMAL LOW (ref 36.0–46.0)
Hemoglobin: 10.3 g/dL — ABNORMAL LOW (ref 12.0–15.0)
MCH: 20.9 pg — ABNORMAL LOW (ref 26.0–34.0)
MCHC: 28.9 g/dL — ABNORMAL LOW (ref 30.0–36.0)
MCV: 72.1 fL — ABNORMAL LOW (ref 80.0–100.0)
Platelets: 491 10*3/uL — ABNORMAL HIGH (ref 150–400)
RBC: 4.94 MIL/uL (ref 3.87–5.11)
RDW: 19.8 % — ABNORMAL HIGH (ref 11.5–15.5)
WBC: 6.5 10*3/uL (ref 4.0–10.5)
nRBC: 0 % (ref 0.0–0.2)

## 2022-03-24 LAB — POCT PREGNANCY, URINE: Preg Test, Ur: NEGATIVE

## 2022-03-24 SURGERY — REMOVAL, HARDWARE
Anesthesia: General | Site: Wrist | Laterality: Right

## 2022-03-24 MED ORDER — 0.9 % SODIUM CHLORIDE (POUR BTL) OPTIME
TOPICAL | Status: DC | PRN
  Administered 2022-03-24: 1000 mL

## 2022-03-24 MED ORDER — ONDANSETRON HCL 4 MG/2ML IJ SOLN
INTRAMUSCULAR | Status: DC | PRN
  Administered 2022-03-24: 4 mg via INTRAVENOUS

## 2022-03-24 MED ORDER — MIDAZOLAM HCL 2 MG/2ML IJ SOLN
INTRAMUSCULAR | Status: DC | PRN
  Administered 2022-03-24: 2 mg via INTRAVENOUS

## 2022-03-24 MED ORDER — CHLORHEXIDINE GLUCONATE 0.12 % MT SOLN
15.0000 mL | Freq: Once | OROMUCOSAL | Status: AC
  Administered 2022-03-24: 15 mL via OROMUCOSAL
  Filled 2022-03-24: qty 15

## 2022-03-24 MED ORDER — CEFAZOLIN SODIUM-DEXTROSE 2-4 GM/100ML-% IV SOLN
2.0000 g | INTRAVENOUS | Status: AC
  Administered 2022-03-24: 2 g via INTRAVENOUS
  Filled 2022-03-24: qty 100

## 2022-03-24 MED ORDER — ACETAMINOPHEN 500 MG PO TABS
1000.0000 mg | ORAL_TABLET | Freq: Once | ORAL | Status: AC
  Administered 2022-03-24: 1000 mg via ORAL
  Filled 2022-03-24: qty 2

## 2022-03-24 MED ORDER — OXYCODONE HCL 5 MG PO TABS
5.0000 mg | ORAL_TABLET | Freq: Once | ORAL | Status: AC | PRN
  Administered 2022-03-24: 5 mg via ORAL

## 2022-03-24 MED ORDER — HYDROCODONE-ACETAMINOPHEN 5-325 MG PO TABS: 1.0000 | ORAL_TABLET | Freq: Four times a day (QID) | ORAL | 0 refills | Status: AC | PRN

## 2022-03-24 MED ORDER — ONDANSETRON HCL 4 MG/2ML IJ SOLN
INTRAMUSCULAR | Status: AC
  Filled 2022-03-24: qty 2

## 2022-03-24 MED ORDER — FENTANYL CITRATE (PF) 250 MCG/5ML IJ SOLN
INTRAMUSCULAR | Status: DC | PRN
  Administered 2022-03-24 (×4): 50 ug via INTRAVENOUS

## 2022-03-24 MED ORDER — PROMETHAZINE HCL 25 MG/ML IJ SOLN: 6.2500 mg | INTRAMUSCULAR | Status: DC | PRN

## 2022-03-24 MED ORDER — OXYCODONE HCL 5 MG PO TABS
ORAL_TABLET | ORAL | Status: AC
  Filled 2022-03-24: qty 1

## 2022-03-24 MED ORDER — ORAL CARE MOUTH RINSE: 15.0000 mL | Freq: Once | OROMUCOSAL | Status: AC

## 2022-03-24 MED ORDER — FENTANYL CITRATE (PF) 250 MCG/5ML IJ SOLN
INTRAMUSCULAR | Status: AC
  Filled 2022-03-24: qty 5

## 2022-03-24 MED ORDER — MIDAZOLAM HCL 2 MG/2ML IJ SOLN
INTRAMUSCULAR | Status: AC
  Filled 2022-03-24: qty 2

## 2022-03-24 MED ORDER — LIDOCAINE 2% (20 MG/ML) 5 ML SYRINGE
INTRAMUSCULAR | Status: DC | PRN
  Administered 2022-03-24: 60 mg via INTRAVENOUS

## 2022-03-24 MED ORDER — DEXAMETHASONE SODIUM PHOSPHATE 10 MG/ML IJ SOLN
INTRAMUSCULAR | Status: DC | PRN
  Administered 2022-03-24: 10 mg via INTRAVENOUS

## 2022-03-24 MED ORDER — DEXAMETHASONE SODIUM PHOSPHATE 10 MG/ML IJ SOLN
INTRAMUSCULAR | Status: AC
  Filled 2022-03-24: qty 1

## 2022-03-24 MED ORDER — BUPIVACAINE HCL (PF) 0.25 % IJ SOLN
INTRAMUSCULAR | Status: DC | PRN
  Administered 2022-03-24: 20 mL

## 2022-03-24 MED ORDER — FENTANYL CITRATE (PF) 100 MCG/2ML IJ SOLN
25.0000 ug | INTRAMUSCULAR | Status: DC | PRN
  Administered 2022-03-24 (×2): 50 ug via INTRAVENOUS

## 2022-03-24 MED ORDER — LACTATED RINGERS IV SOLN: INTRAVENOUS | Status: DC

## 2022-03-24 MED ORDER — FENTANYL CITRATE (PF) 100 MCG/2ML IJ SOLN
INTRAMUSCULAR | Status: AC
  Filled 2022-03-24: qty 2

## 2022-03-24 MED ORDER — PROPOFOL 10 MG/ML IV BOLUS
INTRAVENOUS | Status: DC | PRN
  Administered 2022-03-24: 200 mg via INTRAVENOUS

## 2022-03-24 MED ORDER — OXYCODONE HCL 5 MG/5ML PO SOLN: 5.0000 mg | Freq: Once | ORAL | Status: AC | PRN

## 2022-03-24 MED ORDER — CELECOXIB 200 MG PO CAPS
200.0000 mg | ORAL_CAPSULE | Freq: Once | ORAL | Status: AC
  Administered 2022-03-24: 200 mg via ORAL
  Filled 2022-03-24: qty 1

## 2022-03-24 MED ORDER — LIDOCAINE 2% (20 MG/ML) 5 ML SYRINGE
INTRAMUSCULAR | Status: AC
  Filled 2022-03-24: qty 5

## 2022-03-24 MED ORDER — BUPIVACAINE HCL (PF) 0.25 % IJ SOLN
INTRAMUSCULAR | Status: AC
  Filled 2022-03-24: qty 30

## 2022-03-24 SURGICAL SUPPLY — 53 items
BAG COUNTER SPONGE SURGICOUNT (BAG) ×1 IMPLANT
BAG SPNG CNTER NS LX DISP (BAG) ×1
BNDG CMPR 5X3 KNIT ELC UNQ LF (GAUZE/BANDAGES/DRESSINGS) ×1
BNDG CMPR 9X4 STRL LF SNTH (GAUZE/BANDAGES/DRESSINGS) ×1
BNDG ELASTIC 3INX 5YD STR LF (GAUZE/BANDAGES/DRESSINGS) ×1 IMPLANT
BNDG ELASTIC 4X5.8 VLCR STR LF (GAUZE/BANDAGES/DRESSINGS) ×1 IMPLANT
BNDG ESMARK 4X9 LF (GAUZE/BANDAGES/DRESSINGS) ×1 IMPLANT
BNDG GAUZE DERMACEA FLUFF 4 (GAUZE/BANDAGES/DRESSINGS) ×2 IMPLANT
BNDG GZE DERMACEA 4 6PLY (GAUZE/BANDAGES/DRESSINGS) ×1
CORD BIPOLAR FORCEPS 12FT (ELECTRODE) IMPLANT
COVER SURGICAL LIGHT HANDLE (MISCELLANEOUS) ×1 IMPLANT
CUFF TOURN SGL QUICK 24 (TOURNIQUET CUFF) ×1
CUFF TRNQT CYL 24X4X16.5-23 (TOURNIQUET CUFF) IMPLANT
DRAPE SURG 17X23 STRL (DRAPES) ×1 IMPLANT
DRSG XEROFORM 1X8 (GAUZE/BANDAGES/DRESSINGS) IMPLANT
DURAPREP 26ML APPLICATOR (WOUND CARE) ×1 IMPLANT
ELECT REM PT RETURN 9FT ADLT (ELECTROSURGICAL)
ELECTRODE REM PT RTRN 9FT ADLT (ELECTROSURGICAL) IMPLANT
GAUZE SPONGE 4X4 12PLY STRL (GAUZE/BANDAGES/DRESSINGS) ×1 IMPLANT
GAUZE XEROFORM 1X8 LF (GAUZE/BANDAGES/DRESSINGS) ×1 IMPLANT
GLOVE SURG SYN 8.0 (GLOVE) ×1 IMPLANT
GLOVE SURG SYN 8.0 PF PI (GLOVE) ×1 IMPLANT
GOWN STRL REUS W/ TWL LRG LVL3 (GOWN DISPOSABLE) ×1 IMPLANT
GOWN STRL REUS W/ TWL XL LVL3 (GOWN DISPOSABLE) ×1 IMPLANT
GOWN STRL REUS W/TWL LRG LVL3 (GOWN DISPOSABLE) ×1
GOWN STRL REUS W/TWL XL LVL3 (GOWN DISPOSABLE) ×1
KIT BASIN OR (CUSTOM PROCEDURE TRAY) ×1 IMPLANT
KIT TURNOVER KIT B (KITS) ×1 IMPLANT
MANIFOLD NEPTUNE II (INSTRUMENTS) ×1 IMPLANT
NDL 22X1.5 STRL (OR ONLY) (MISCELLANEOUS) IMPLANT
NDL HYPO 25GX1X1/2 BEV (NEEDLE) IMPLANT
NEEDLE 22X1.5 STRL (OR ONLY) (MISCELLANEOUS) IMPLANT
NEEDLE HYPO 25GX1X1/2 BEV (NEEDLE) IMPLANT
NS IRRIG 1000ML POUR BTL (IV SOLUTION) ×1 IMPLANT
PACK ORTHO EXTREMITY (CUSTOM PROCEDURE TRAY) ×1 IMPLANT
PAD ARMBOARD 7.5X6 YLW CONV (MISCELLANEOUS) ×2 IMPLANT
PAD CAST 3X4 CTTN HI CHSV (CAST SUPPLIES) ×1 IMPLANT
PAD CAST 4YDX4 CTTN HI CHSV (CAST SUPPLIES) ×1 IMPLANT
PADDING CAST COTTON 3X4 STRL (CAST SUPPLIES) ×1
PADDING CAST COTTON 4X4 STRL (CAST SUPPLIES) ×1
PENCIL BUTTON HOLSTER BLD 10FT (ELECTRODE) IMPLANT
SPLINT PLASTER CAST FAST 4X15 (CAST SUPPLIES) IMPLANT
SPONGE T-LAP 4X18 ~~LOC~~+RFID (SPONGE) ×2 IMPLANT
STRIP CLOSURE SKIN 1/2X4 (GAUZE/BANDAGES/DRESSINGS) IMPLANT
SUCTION FRAZIER HANDLE 10FR (MISCELLANEOUS)
SUCTION TUBE FRAZIER 10FR DISP (MISCELLANEOUS) IMPLANT
SUT PROLENE 3 0 PS 2 (SUTURE) IMPLANT
SYR CONTROL 10ML LL (SYRINGE) IMPLANT
TOWEL GREEN STERILE (TOWEL DISPOSABLE) ×1 IMPLANT
TOWEL GREEN STERILE FF (TOWEL DISPOSABLE) ×1 IMPLANT
TUBE CONNECTING 12X1/4 (SUCTIONS) IMPLANT
UNDERPAD 30X36 HEAVY ABSORB (UNDERPADS AND DIAPERS) ×1 IMPLANT
WATER STERILE IRR 1000ML POUR (IV SOLUTION) ×1 IMPLANT

## 2022-03-24 NOTE — Op Note (Signed)
Patient was taken to the operating suite and after induction of adequate general anesthetic the right upper extremity was prepped and draped in the usual sterile fashion.  An Esmarch was used to exsanguinate the limb the tourniquet was inflated to 250 mmHg.  This point time we used her old incisions from previous application of a dorsal bridge plate over the interval between the index and long metacarpal distally and the distal third of the radius proximally.  Dissection was carried down distally between the interval of the index and long metacarpal and we identified the plate on the dorsal aspect of the index metacarpal.  3 cortical screws were removed.  We then dissected proximally down to the plate at the proximal most extent and remove the 3 screws.  We then gently remove the plate subcutaneously from proximal to distal.  The ear wound was then irrigated and loosely closed with 4-0 nylon.  Xeroform, 4 x 4's, and a compressive dressing and a palmar plaster splint was applied.  Patient tolerated this procedure well went to recovery in stable fashion.

## 2022-03-24 NOTE — Brief Op Note (Signed)
03/24/2022  2:50 PM  PATIENT:  Courtney Mitchell  27 y.o. female  PRE-OPERATIVE DIAGNOSIS:  RIGHT WRIST PAINFUL HARDWARE  POST-OPERATIVE DIAGNOSIS:  RIGHT WRIST PAINFUL HARDWARE  PROCEDURE:  Procedure(s): REMOVAL OF RIGHT WRIST HARDWARE (Right)  SURGEON:  Surgeon(s) and Role:    Charlotte Crumb, MD - Primary  PHYSICIAN ASSISTANT:   ASSISTANTS: none   ANESTHESIA:   general  EBL:  0 mL   BLOOD ADMINISTERED:none  DRAINS: none   LOCAL MEDICATIONS USED:  MARCAINE     SPECIMEN:  No Specimen  DISPOSITION OF SPECIMEN:  N/A  COUNTS:  YES  TOURNIQUET:  * Missing tourniquet times found for documented tourniquets in log: [id number] *  DICTATION: .Dragon Dictation  PLAN OF CARE: Discharge to home after PACU  PATIENT DISPOSITION:  PACU - hemodynamically stable.   Delay start of Pharmacological VTE agent (>24hrs) due to surgical blood loss or risk of bleeding: yes

## 2022-03-24 NOTE — Anesthesia Postprocedure Evaluation (Signed)
Anesthesia Post Note  Patient: Courtney Mitchell  Procedure(s) Performed: REMOVAL OF RIGHT WRIST HARDWARE (Right: Wrist)     Patient location during evaluation: PACU Anesthesia Type: General Level of consciousness: awake and alert Pain management: pain level controlled Vital Signs Assessment: post-procedure vital signs reviewed and stable Respiratory status: spontaneous breathing, nonlabored ventilation and respiratory function stable Cardiovascular status: stable and blood pressure returned to baseline Anesthetic complications: no   No notable events documented.  Last Vitals:  Vitals:   03/24/22 1530 03/24/22 1545  BP: (!) 159/119 (!) 140/90  Pulse: 99 72  Resp: 13 11  Temp:    SpO2: 99% 99%    Last Pain:  Vitals:   03/24/22 1545  TempSrc:   PainSc: [place]

## 2022-03-24 NOTE — Anesthesia Procedure Notes (Signed)
Procedure Name: LMA Insertion Date/Time: 03/24/2022 2:15 PM  Performed by: Dorthea Cove, CRNAPre-anesthesia Checklist: Patient identified, Emergency Drugs available, Suction available and Patient being monitored Patient Re-evaluated:Patient Re-evaluated prior to induction Oxygen Delivery Method: Circle System Utilized Preoxygenation: Pre-oxygenation with 100% oxygen Induction Type: IV induction Ventilation: Mask ventilation without difficulty LMA: LMA inserted LMA Size: 4.0 Number of attempts: 1 Placement Confirmation: positive ETCO2 Tube secured with: Tape Dental Injury: Teeth and Oropharynx as per pre-operative assessment  Comments: LMA placment by Judd Gaudier, SRNA

## 2022-03-24 NOTE — H&P (Signed)
Courtney Mitchell is an 27 y.o. female.   Chief Complaint: Right wrist pain and loss of motion HPI: Patient is a very pleasant 27 year old female right-hand-dominant status post multiple trauma with distal radius fracture fixation done several months ago with application of dorsal bridge plate  Past Medical History:  Diagnosis Date   Anemia    Menorrhagia 08/03/2011   Trichomoniasis     Past Surgical History:  Procedure Laterality Date   DILATION AND EVACUATION N/A 03/03/2015   Procedure: DILATATION AND EVACUATION;  Surgeon: Delsa Bern, MD;  Location: [facility];  Service: Gynecology;  Laterality: N/A;   DILATION AND EVACUATION N/A 04/12/2015   Procedure: DILATATION AND EVACUATION;  Surgeon: Everett Graff, MD;  Location: [facility];  Service: Gynecology;  Laterality: N/A;   LIPOSUCTION     WISDOM TOOTH EXTRACTION      Family History  Problem Relation Age of Onset   Cancer Other    Asthma Father    Hypertension Father    Social History:  reports that she has never smoked. She has never used smokeless tobacco. She reports current alcohol use. She reports that she does not use drugs.  Allergies:  Allergies  Allergen Reactions   Clindamycin/Lincomycin Hives    Medications Prior to Admission  Medication Sig Dispense Refill   acetaminophen (TYLENOL) 325 MG tablet Take 975-1,300 mg by mouth every 6 (six) hours as needed for moderate pain.     ibuprofen (ADVIL) 200 MG tablet Take 400 mg by mouth every 6 (six) hours as needed for moderate pain.      Results for orders placed or performed during the hospital encounter of 03/24/22 (from the past 48 hour(s))  CBC per protocol     Status: Abnormal   Collection Time: 03/24/22 11:18 AM  Result Value Ref Range   WBC 6.5 4.0 - 10.5 K/uL   RBC 4.94 3.87 - 5.11 MIL/uL   Hemoglobin 10.3 (L) 12.0 - 15.0 g/dL   HCT 35.6 (L) 36.0 - 46.0 %   MCV 72.1 (L) 80.0 - 100.0 fL   MCH 20.9 (L) 26.0 - 34.0 pg   MCHC 28.9 (L) 30.0 - 36.0 g/dL   RDW 19.8 (H)  11.5 - 15.5 %   Platelets 491 (H) 150 - 400 K/uL   nRBC 0.0 0.0 - 0.2 %    Comment: Performed at [facility], [address] [place], [place] [address]  Pregnancy, urine POC     Status: None   Collection Time: 03/24/22 11:36 AM  Result Value Ref Range   Preg Test, Ur NEGATIVE NEGATIVE    Comment:        THE SENSITIVITY OF THIS METHODOLOGY IS >24 mIU/mL    No results found.  Review of Systems  All other systems reviewed and are negative.   Blood pressure (!) 149/97, pulse 75, temperature 98.5 F (36.9 C), temperature source Oral, resp. rate 20, height 5\' 5"  (1.651 m), weight 86.2 kg, last menstrual period 03/09/2022, SpO2 99 %, currently breastfeeding. Physical Exam Constitutional:      Appearance: Normal appearance.  HENT:     Head: Normocephalic and atraumatic.  Eyes:     Pupils: Pupils are equal, round, and reactive to light.  Cardiovascular:     Rate and Rhythm: Normal rate.  Pulmonary:     Effort: Pulmonary effort is normal.  Musculoskeletal:     Right wrist: Swelling and tenderness present. Decreased range of motion.     Cervical back: Normal range of  motion.     Comments: Right wrist tenderness and decreased range of motion due to bridge plate application several months ago  Neurological:     General: No focal deficit present.     Mental Status: She is alert and oriented to person, place, and time.  Psychiatric:        Mood and Affect: Mood normal.        Behavior: Behavior normal.        Thought Content: Thought content normal.        Judgment: Judgment normal.      Assessment/Plan 27 year old female status post operative intervention for displaced intra-articular distal radius fracture dominant right wrist with application of dorsal bridge plate done elsewhere.  Patient is a least 6 months postop and therefore we recommend dorsal bridge plate removal gentle wrist manipulation.  Patient understands risk and benefits and wishes to proceed  Schuyler Amor, MD 03/24/2022, 12:38 PM

## 2022-03-24 NOTE — Transfer of Care (Signed)
Immediate Anesthesia Transfer of Care Note  Patient: Courtney Mitchell  Procedure(s) Performed: REMOVAL OF RIGHT WRIST HARDWARE (Right: Wrist)  Patient Location: PACU  Anesthesia Type:General  Level of Consciousness: awake, alert , and oriented  Airway & Oxygen Therapy: Patient Spontanous Breathing  Post-op Assessment: Report given to RN and Post -op Vital signs reviewed and stable  Post vital signs: Reviewed and stable  Last Vitals:  Vitals Value Taken Time  BP 161/97 03/24/22 1505  Temp 36.4 C 03/24/22 1505  Pulse 103 03/24/22 1508  Resp 13 03/24/22 1508  SpO2 97 % 03/24/22 1508  Vitals shown include unvalidated device data.  Last Pain:  Vitals:   03/24/22 1116  TempSrc:   PainSc: 0-No pain      Patients Stated Pain Goal: 3 ([id number])  Complications: No notable events documented.

## 2022-03-24 NOTE — Anesthesia Preprocedure Evaluation (Addendum)
Anesthesia Evaluation  Patient identified by MRN, date of birth, ID band Patient awake    Reviewed: Allergy & Precautions, NPO status , Patient's Chart, lab work & pertinent test results  History of Anesthesia Complications Negative for: history of anesthetic complications  Airway Mallampati: I  TM Distance: >3 FB Neck ROM: Full    Dental  (+) Dental Advisory Given  Veneers :   Pulmonary neg pulmonary ROS   Pulmonary exam normal        Cardiovascular negative cardio ROS Normal cardiovascular exam     Neuro/Psych negative neurological ROS  negative psych ROS   GI/Hepatic negative GI ROS, Neg liver ROS,,,  Endo/Other   Obesity   Renal/GU negative Renal ROS     Musculoskeletal negative musculoskeletal ROS (+)    Abdominal   Peds  Hematology  (+) Blood dyscrasia, anemia   Anesthesia Other Findings   Reproductive/Obstetrics                             Anesthesia Physical Anesthesia Plan  ASA: 2  Anesthesia Plan: General   Post-op Pain Management: Tylenol PO (pre-op)* and Celebrex PO (pre-op)*   Induction: Intravenous  PONV Risk Score and Plan: 3 and Treatment may vary due to age or medical condition, Ondansetron, Dexamethasone and Midazolam  Airway Management Planned: LMA  Additional Equipment: None  Intra-op Plan:   Post-operative Plan: Extubation in OR  Informed Consent: I have reviewed the patients History and Physical, chart, labs and discussed the procedure including the risks, benefits and alternatives for the proposed anesthesia with the patient or authorized representative who has indicated his/her understanding and acceptance.     Dental advisory given  Plan Discussed with: CRNA and Anesthesiologist  Anesthesia Plan Comments:        Anesthesia Quick Evaluation

## 2022-03-25 ENCOUNTER — Encounter (HOSPITAL_COMMUNITY): Payer: Self-pay | Admitting: Orthopedic Surgery
